# Patient Record
Sex: Male | Born: 1960 | Race: White | Hispanic: No | Marital: Single | Smoking: Never smoker
Health system: Southern US, Community
[De-identification: ages and names within clinical notes are randomized; demographics above are authoritative.]

## PROBLEM LIST (undated history)

## (undated) DIAGNOSIS — K219 Gastro-esophageal reflux disease without esophagitis: Secondary | ICD-10-CM

## (undated) DIAGNOSIS — I10 Essential (primary) hypertension: Secondary | ICD-10-CM

## (undated) DIAGNOSIS — I82409 Acute embolism and thrombosis of unspecified deep veins of unspecified lower extremity: Secondary | ICD-10-CM

## (undated) DIAGNOSIS — G8929 Other chronic pain: Secondary | ICD-10-CM

## (undated) HISTORY — PX: TOTAL HIP ARTHROPLASTY: SHX124

---

## 2009-12-11 ENCOUNTER — Inpatient Hospital Stay (HOSPITAL_COMMUNITY): Admission: RE | Admit: 2009-12-11 | Discharge: 2009-12-19 | Payer: Self-pay | Admitting: Orthopedic Surgery

## 2009-12-12 ENCOUNTER — Encounter (INDEPENDENT_AMBULATORY_CARE_PROVIDER_SITE_OTHER): Payer: Self-pay | Admitting: Orthopedic Surgery

## 2009-12-22 ENCOUNTER — Emergency Department (HOSPITAL_COMMUNITY): Admission: EM | Admit: 2009-12-22 | Discharge: 2009-12-23 | Payer: Self-pay | Admitting: Emergency Medicine

## 2009-12-22 ENCOUNTER — Encounter (INDEPENDENT_AMBULATORY_CARE_PROVIDER_SITE_OTHER): Payer: Self-pay | Admitting: Emergency Medicine

## 2010-04-30 LAB — PROTIME-INR
INR: 1.33 (ref 0.00–1.49)
Prothrombin Time: 16.7 seconds — ABNORMAL HIGH (ref 11.6–15.2)

## 2010-05-01 LAB — BASIC METABOLIC PANEL
BUN: 3 mg/dL — ABNORMAL LOW (ref 6–23)
BUN: 4 mg/dL — ABNORMAL LOW (ref 6–23)
CO2: 25 mEq/L (ref 19–32)
CO2: 29 mEq/L (ref 19–32)
Chloride: 101 mEq/L (ref 96–112)
Chloride: 103 mEq/L (ref 96–112)
Chloride: 107 mEq/L (ref 96–112)
Creatinine, Ser: 0.65 mg/dL (ref 0.4–1.5)
Creatinine, Ser: 0.94 mg/dL (ref 0.4–1.5)
GFR calc Af Amer: >60 mL/min (ref >60)
GFR calc Af Amer: >60 mL/min (ref >60)
GFR calc Af Amer: >60 mL/min (ref >60)
GFR calc non Af Amer: >60 mL/min (ref >60)
GFR calc non Af Amer: >60 mL/min (ref >60)
Glucose, Bld: 132 mg/dL — ABNORMAL HIGH (ref 70–99)
Potassium: 3.2 mEq/L — ABNORMAL LOW (ref 3.5–5.1)
Potassium: 4 mEq/L (ref 3.5–5.1)
Potassium: 5.1 mEq/L (ref 3.5–5.1)
Sodium: 137 mEq/L (ref 135–145)
Sodium: 137 mEq/L (ref 135–145)

## 2010-05-01 LAB — PROTIME-INR
INR: 0.96 (ref 0.00–1.49)
INR: 1.77 — ABNORMAL HIGH (ref 0.00–1.49)
INR: 2.45 — ABNORMAL HIGH (ref 0.00–1.49)
INR: 2.85 — ABNORMAL HIGH (ref 0.00–1.49)
Prothrombin Time: 13 seconds (ref 11.6–15.2)
Prothrombin Time: 26.7 seconds — ABNORMAL HIGH (ref 11.6–15.2)

## 2010-05-01 LAB — CBC
HCT: 30.3 % — ABNORMAL LOW (ref 39.0–52.0)
HCT: 33.9 % — ABNORMAL LOW (ref 39.0–52.0)
HCT: 42.5 % (ref 39.0–52.0)
Hemoglobin: 14.7 g/dL (ref 13.0–17.0)
Hemoglobin: 8.8 g/dL — ABNORMAL LOW (ref 13.0–17.0)
Hemoglobin: 9.9 g/dL — ABNORMAL LOW (ref 13.0–17.0)
MCH: 32 pg (ref 26.0–34.0)
MCH: 32.2 pg (ref 26.0–34.0)
MCH: 33.1 pg (ref 26.0–34.0)
MCV: 96 fL (ref 78.0–100.0)
MCV: 97.5 fL (ref 78.0–100.0)
MCV: 97.7 fL (ref 78.0–100.0)
MCV: 99 fL (ref 78.0–100.0)
Platelets: 298 10*3/uL (ref 150–400)
Platelets: 330 10*3/uL (ref 150–400)
Platelets: 406 10*3/uL — ABNORMAL HIGH (ref 150–400)
Platelets: 428 10*3/uL — ABNORMAL HIGH (ref 150–400)
RBC: 2.73 MIL/uL — ABNORMAL LOW (ref 4.22–5.81)
RBC: 2.78 MIL/uL — ABNORMAL LOW (ref 4.22–5.81)
RBC: 3.06 MIL/uL — ABNORMAL LOW (ref 4.22–5.81)
RBC: 3.47 MIL/uL — ABNORMAL LOW (ref 4.22–5.81)
RBC: 4.39 MIL/uL (ref 4.22–5.81)
RDW: 12.6 % (ref 11.5–15.5)
RDW: 13.1 % (ref 11.5–15.5)
WBC: 10.9 10*3/uL — ABNORMAL HIGH (ref 4.0–10.5)
WBC: 13.9 10*3/uL — ABNORMAL HIGH (ref 4.0–10.5)
WBC: 8 10*3/uL (ref 4.0–10.5)

## 2010-05-01 LAB — COMPREHENSIVE METABOLIC PANEL
ALT: 13 U/L (ref 0–53)
Albumin: 2.6 g/dL — ABNORMAL LOW (ref 3.5–5.2)
Alkaline Phosphatase: 62 U/L (ref 39–117)
BUN: 2 mg/dL — ABNORMAL LOW (ref 6–23)
BUN: 5 mg/dL — ABNORMAL LOW (ref 6–23)
CO2: 31 mEq/L (ref 19–32)
Chloride: 105 mEq/L (ref 96–112)
Creatinine, Ser: 0.65 mg/dL (ref 0.4–1.5)
GFR calc non Af Amer: >60 mL/min (ref >60)
Potassium: 3.2 mEq/L — ABNORMAL LOW (ref 3.5–5.1)
Sodium: 139 mEq/L (ref 135–145)
Total Bilirubin: 0.4 mg/dL (ref 0.3–1.2)
Total Protein: 5.9 g/dL — ABNORMAL LOW (ref 6.0–8.3)

## 2010-05-01 LAB — CLOSTRIDIUM DIFFICILE EIA: C difficile Toxins A+B, EIA: NEGATIVE

## 2010-05-01 LAB — GLUCOSE, CAPILLARY

## 2010-05-01 LAB — TYPE AND SCREEN: Antibody Screen: NEGATIVE

## 2010-05-01 LAB — APTT: aPTT: 29 seconds (ref 24–37)

## 2011-03-11 IMAGING — CR DG ABDOMEN 1V
1 series · 1 of 1 positions shown · non-contrast
Comparison: 12/15/2009 and 12/14/2009.

CLINICAL DATA: Follow up ileus.  Patient is recently postop for hip
arthroplasty.

ABDOMEN - 1 VIEW

[t abdomen supine]
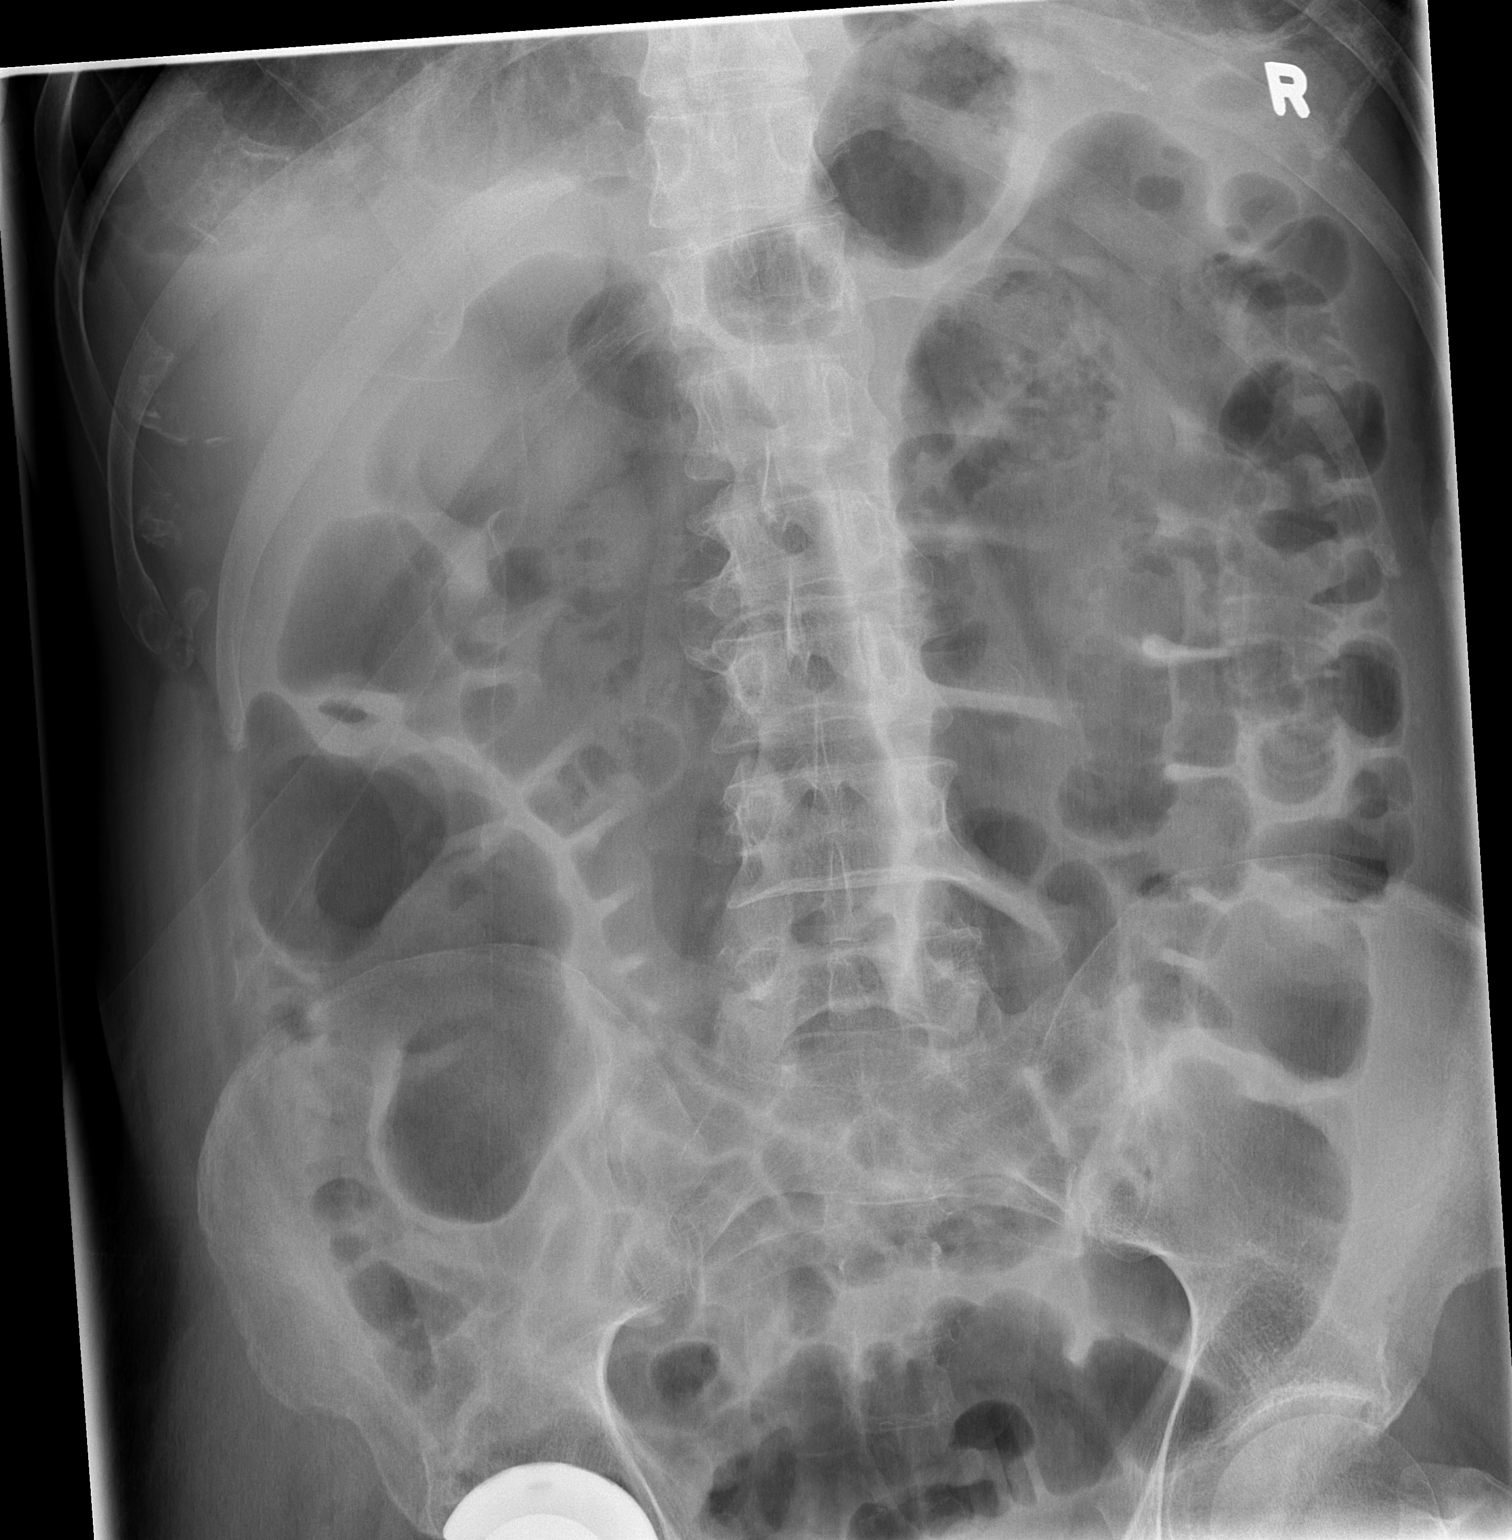

[1 of 1 positions shown; findings below may reference images not displayed]

FINDINGS: There is diffuse gas throughout the colon and some bowel
loops.  The gaseous distention of the colon has significantly
decreased since 12/15/2009.  The cecum now measures 6.6 cm
transverse diameter (previously 10.7 cm on 12/15/2009).
Additionally, the diameter of the transverse and descending colon
has decreased.  The stomach is not distended.  Postoperative
changes of right hip arthroplasty partially imaged.
IMPRESSION: Decreasing colonic distention.  Findings compatible with resolving
ileus in this postoperative patient.

## 2012-02-27 ENCOUNTER — Encounter: Payer: Self-pay | Admitting: Physical Medicine & Rehabilitation

## 2012-03-22 ENCOUNTER — Ambulatory Visit: Payer: Medicare Other | Admitting: Physical Medicine & Rehabilitation

## 2012-03-22 ENCOUNTER — Encounter: Payer: Medicare Other | Attending: Physical Medicine & Rehabilitation

## 2015-11-07 NOTE — Plan of Care (Signed)
55 year old male presenting with shortness of breath has PE on CT Angio. Initially mildly hypotensive but improved with iv fluids. PCCM was consulted. Advised admission under hospitalist and Heparin.  Midge MiniumArshad Kakrakandy.

## 2015-11-08 ENCOUNTER — Inpatient Hospital Stay (HOSPITAL_COMMUNITY): Payer: Medicare HMO

## 2015-11-08 ENCOUNTER — Inpatient Hospital Stay (HOSPITAL_COMMUNITY)
Admission: AD | Admit: 2015-11-08 | Discharge: 2015-11-12 | DRG: 175 | Disposition: A | Discharge status: Home / Self Care | Payer: Medicare HMO | Source: Other Acute Inpatient Hospital | Attending: Family Medicine | Admitting: Family Medicine

## 2015-11-08 ENCOUNTER — Encounter (HOSPITAL_COMMUNITY): Payer: Self-pay | Admitting: Internal Medicine

## 2015-11-08 DIAGNOSIS — Z86718 Personal history of other venous thrombosis and embolism: Secondary | ICD-10-CM

## 2015-11-08 DIAGNOSIS — I82491 Acute embolism and thrombosis of other specified deep vein of right lower extremity: Secondary | ICD-10-CM | POA: Diagnosis not present

## 2015-11-08 DIAGNOSIS — Z96649 Presence of unspecified artificial hip joint: Secondary | ICD-10-CM | POA: Diagnosis present

## 2015-11-08 DIAGNOSIS — D72829 Elevated white blood cell count, unspecified: Secondary | ICD-10-CM | POA: Diagnosis present

## 2015-11-08 DIAGNOSIS — G8929 Other chronic pain: Secondary | ICD-10-CM | POA: Diagnosis present

## 2015-11-08 DIAGNOSIS — I82413 Acute embolism and thrombosis of femoral vein, bilateral: Secondary | ICD-10-CM | POA: Diagnosis present

## 2015-11-08 DIAGNOSIS — I2699 Other pulmonary embolism without acute cor pulmonale: Secondary | ICD-10-CM | POA: Diagnosis present

## 2015-11-08 DIAGNOSIS — I82511 Chronic embolism and thrombosis of right femoral vein: Secondary | ICD-10-CM | POA: Diagnosis not present

## 2015-11-08 DIAGNOSIS — J181 Lobar pneumonia, unspecified organism: Secondary | ICD-10-CM

## 2015-11-08 DIAGNOSIS — Z23 Encounter for immunization: Secondary | ICD-10-CM

## 2015-11-08 DIAGNOSIS — K219 Gastro-esophageal reflux disease without esophagitis: Secondary | ICD-10-CM | POA: Diagnosis present

## 2015-11-08 DIAGNOSIS — J189 Pneumonia, unspecified organism: Secondary | ICD-10-CM | POA: Diagnosis present

## 2015-11-08 DIAGNOSIS — I10 Essential (primary) hypertension: Secondary | ICD-10-CM | POA: Diagnosis present

## 2015-11-08 DIAGNOSIS — N179 Acute kidney failure, unspecified: Secondary | ICD-10-CM | POA: Diagnosis present

## 2015-11-08 DIAGNOSIS — I82403 Acute embolism and thrombosis of unspecified deep veins of lower extremity, bilateral: Secondary | ICD-10-CM | POA: Diagnosis not present

## 2015-11-08 DIAGNOSIS — I959 Hypotension, unspecified: Secondary | ICD-10-CM | POA: Diagnosis present

## 2015-11-08 DIAGNOSIS — I2692 Saddle embolus of pulmonary artery without acute cor pulmonale: Secondary | ICD-10-CM | POA: Diagnosis not present

## 2015-11-08 DIAGNOSIS — I950 Idiopathic hypotension: Secondary | ICD-10-CM

## 2015-11-08 HISTORY — DX: Gastro-esophageal reflux disease without esophagitis: K21.9

## 2015-11-08 HISTORY — DX: Acute embolism and thrombosis of unspecified deep veins of unspecified lower extremity: I82.409

## 2015-11-08 HISTORY — DX: Other chronic pain: G89.29

## 2015-11-08 HISTORY — DX: Essential (primary) hypertension: I10

## 2015-11-08 LAB — ECHOCARDIOGRAM COMPLETE
HEIGHTINCHES: 72 in
WEIGHTICAEL: 2412.71 [oz_av]

## 2015-11-08 LAB — CBC
HEMATOCRIT: 38.1 % — AB (ref 39.0–52.0)
Hemoglobin: 11.7 g/dL — ABNORMAL LOW (ref 13.0–17.0)
MCH: 28.3 pg (ref 26.0–34.0)
MCHC: 30.7 g/dL (ref 30.0–36.0)
MCV: 92.3 fL (ref 78.0–100.0)
Platelets: 281 10*3/uL (ref 150–400)
RBC: 4.13 MIL/uL — ABNORMAL LOW (ref 4.22–5.81)
RDW: 15 % (ref 11.5–15.5)
WBC: 5.7 10*3/uL (ref 4.0–10.5)

## 2015-11-08 LAB — TROPONIN I

## 2015-11-08 LAB — MRSA PCR SCREENING: MRSA BY PCR: NEGATIVE

## 2015-11-08 LAB — HEPARIN LEVEL (UNFRACTIONATED)
Heparin Unfractionated: 0.3 IU/mL (ref 0.30–0.70)
Heparin Unfractionated: 0.24 IU/mL — ABNORMAL LOW (ref 0.30–0.70)

## 2015-11-08 MED ORDER — PANTOPRAZOLE SODIUM 40 MG PO TBEC
80.0000 mg | DELAYED_RELEASE_TABLET | Freq: Every day | ORAL | Status: DC
  Administered 2015-11-08 – 2015-11-12 (×5): 80 mg via ORAL
  Filled 2015-11-08 (×7): qty 2

## 2015-11-08 MED ORDER — DIAZEPAM 5 MG PO TABS
10.0000 mg | ORAL_TABLET | Freq: Two times a day (BID) | ORAL | Status: DC
  Administered 2015-11-08 – 2015-11-10 (×5): 10 mg via ORAL
  Filled 2015-11-08 (×5): qty 2

## 2015-11-08 MED ORDER — AZITHROMYCIN 250 MG PO TABS
250.0000 mg | ORAL_TABLET | Freq: Every day | ORAL | Status: AC
  Administered 2015-11-09 – 2015-11-12 (×4): 250 mg via ORAL
  Filled 2015-11-08 (×4): qty 1

## 2015-11-08 MED ORDER — OXYCODONE HCL 5 MG PO TABS
20.0000 mg | ORAL_TABLET | Freq: Four times a day (QID) | ORAL | Status: DC | PRN
  Administered 2015-11-08 – 2015-11-12 (×10): 20 mg via ORAL
  Filled 2015-11-08 (×10): qty 4

## 2015-11-08 MED ORDER — HEPARIN (PORCINE) IN NACL 100-0.45 UNIT/ML-% IJ SOLN
1550.0000 [IU]/h | INTRAMUSCULAR | Status: AC
  Administered 2015-11-08: 1150 [IU]/h via INTRAVENOUS
  Administered 2015-11-08: 1300 [IU]/h via INTRAVENOUS
  Administered 2015-11-09 – 2015-11-10 (×2): 1550 [IU]/h via INTRAVENOUS
  Filled 2015-11-08 (×3): qty 250

## 2015-11-08 MED ORDER — SODIUM CHLORIDE 0.9 % IV SOLN
INTRAVENOUS | Status: DC
  Administered 2015-11-08 – 2015-11-11 (×8): via INTRAVENOUS

## 2015-11-08 MED ORDER — AZITHROMYCIN 500 MG PO TABS
500.0000 mg | ORAL_TABLET | Freq: Every day | ORAL | Status: AC
  Administered 2015-11-08: 500 mg via ORAL
  Filled 2015-11-08: qty 1

## 2015-11-08 MED ORDER — INFLUENZA VAC SPLIT QUAD 0.5 ML IM SUSY
0.5000 mL | PREFILLED_SYRINGE | INTRAMUSCULAR | Status: AC
  Administered 2015-11-09: 0.5 mL via INTRAMUSCULAR
  Filled 2015-11-08: qty 0.5

## 2015-11-08 NOTE — Progress Notes (Signed)
ANTICOAGULATION CONSULT NOTE - Initial Consult  Pharmacy Consult for Heparin Indication: pulmonary embolus  Allergies  Allergen Reactions  . Aspirin     Patient Measurements: Height: 6' (182.9 cm) Weight: 150 lb 12.7 oz (68.4 kg) IBW/kg (Calculated) : 77.6 Heparin Dosing Weight:   Vital Signs: Temp: 97.5 F (36.4 C) (09/21 0520) Temp Source: Oral (09/21 0520) BP: 97/75 (09/21 0600) Pulse Rate: 67 (09/21 0600)  Labs: No results for input(s): HGB, HCT, PLT, APTT, LABPROT, INR, HEPARINUNFRC, HEPRLOWMOCWT, CREATININE, CKTOTAL, CKMB, TROPONINI in the last 72 hours.  CrCl cannot be calculated (Patient's most recent lab result is older than the maximum 21 days allowed.).   Medical History: Past Medical History:  Diagnosis Date  . Chronic pain   . 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 GERD (gastroesophageal reflux disease)   . HTN (hypertension)     Medications:  See electronic med rec  Assessment: 55 y.o. M [facility] chest shows b/l PE with evidence of R heart strain and large clot burden.  [facility] WBC 15, Hgb 12.5, HCt 37.8, plt 343, INR 1.2, SCr 1.8,   [facility] with 4000 units bolus and 1050 units/hr.   Goal of Therapy:  Heparin level 0.3-0.7 units/ml Monitor platelets by anticoagulation protocol: Yes   Plan:  Continue heparin at 1050 units/hr Stat heparin level Daily heparin level and CBC  Christoper Fabianaron Noelle Sease, PharmD, BCPS Clinical pharmacist, pager [phone] 11/08/2015,6:54 AM

## 2015-11-08 NOTE — Progress Notes (Signed)
ANTICOAGULATION CONSULT NOTE - Follow Up Consult  Pharmacy Consult for Heparin Indication: PE  Allergies  Allergen Reactions  . Aspirin Hives and Rash    Patient Measurements: Height: 6' (182.9 cm) Weight: 150 lb 12.7 oz (68.4 kg) IBW/kg (Calculated) : 77.6  Vital Signs: Temp: 97.5 F (36.4 C) (09/21 0520) Temp Source: Oral (09/21 0520) BP: 91/61 (09/21 0800) Pulse Rate: 57 (09/21 0800)  Labs:  Recent Labs  11/08/15 0754  HEPARINUNFRC 0.30  TROPONINI <0.03    CrCl cannot be calculated (Patient's most recent lab result is older than the maximum 21 days allowed.).   Medications:  Scheduled:  . [START ON 11/09/2015] azithromycin  250 mg Oral Daily  . diazepam  10 mg Oral BID  . [START ON 11/09/2015] Influenza vac split quadrivalent PF  0.5 mL Intramuscular Tomorrow-1000  . pantoprazole  80 mg Oral Daily    Assessment: 55yo male with PE and R-heart strain, large clot burden.  Heparin level this AM is 0.3 which is just within goal range.  No bleeding noted.  Will bump rate up to assure maintain therapeutic range  Goal of Therapy:  Heparin level 0.3-0.7 units/ml Monitor platelets by anticoagulation protocol: Yes   Plan:  Increase heparin to 1150 units/hr Repeat heparin level in 6hr, & CBC at this time as well Daily Heparin level and CBC Watch for s/s of bleeding  Marisue HumbleKendra Azalee Weimer, PharmD Clinical Pharmacist  [facility]- [facility]

## 2015-11-08 NOTE — Progress Notes (Signed)
Interval Progress Note  [facility]   Patient seen and examined at bedside. Patient report significant improvement after he was admitted. Discussed with patient length of his condition. Questions answered. No other complaints.  Patient's vital signs have improved. Blood pressure remains in the lower side with IV fluids seems to be working. We'll continue heparin drip and IV fluids.  Physical exam unremarkable. Homans sign is negative  We will continue to monitor and ICU unit. Doppler ultrasound ordered. Will follow up with critical care team.  Other conditions as per H&P  Latrelle DodrillEdwin Silva, MD  [facility] P: [phone]

## 2015-11-08 NOTE — H&P (Signed)
History and Physical    Allen GalaKevin L Molenda NFA:[id number] DOB: 09/18/1960 DOA: 11/08/2015   PCP: No primary care provider on file. Chief Complaint: No chief complaint on file.   HPI: Allen Holt is a 55 y.o. male with medical history significant of multiple DVTs in LE following a hip FX a few years back, had been on coumadin until about a year ago.  Family history of blood clots in his father (unsure of exact clotting disorder).  Patient presented to his PCP 2 days ago with c/o 2-3 weeks of passing out.  Symptoms were persistent since onset, actually slightly improving.  Associated generalized weakness and some non-productive cough.  His PCP got labs, noted that his BP was running very low.  Ultimately labs came back yesterday showing acute kidney injury and [facility].  ED Course: [facility], SBP running 90s, improved with IVF.  WBC 15k, creat of 1.8 (presumably acute given above history), sating 94% on room air, 98% on 2L.  EKG showed T wave inversions in 2,3,AVF, ST depression and T wave inversion in V2-V4.  CTA chest was performed and demonstrated large saddle embolus, as well as some tree-in-bud appearance in RLL ? aspiration vs PNA.  Review of Systems: As per HPI otherwise 10 point review of systems negative.    Past Medical History:  Diagnosis Date  . Chronic pain   . 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 GERD (gastroesophageal reflux disease)   . HTN (hypertension)     Past Surgical History:  Procedure Laterality Date  . TOTAL HIP ARTHROPLASTY     for hip fx     reports that he has never smoked. He does not have any smokeless tobacco history on file. He reports that he does not drink alcohol or use drugs.  Allergies  Allergen Reactions  . Aspirin     Family History  Problem Relation Age of Onset  . Clotting disorder Father       Prior to Admission medications   Not on File    Physical  Exam: Vitals:   11/08/15 0520 11/08/15 0600  BP: (!) 80/61 97/75  Pulse:  67  Resp:  (!) 22  Temp: 97.5 F (36.4 C)   TempSrc: Oral   SpO2:  98%  Weight:  68.4 kg (150 lb 12.7 oz)  Height:  6' (1.829 m)      Constitutional: NAD, calm, comfortable Eyes: PERRL, lids and conjunctivae normal ENMT: Mucous membranes are moist. Posterior pharynx clear of any exudate or lesions.Normal dentition.  Neck: normal, supple, no masses, no thyromegaly Respiratory: clear to auscultation bilaterally, no wheezing, no crackles. Normal respiratory effort. No accessory muscle use.  Cardiovascular: Regular rate and rhythm, no murmurs / rubs / gallops. No extremity edema. 2+ pedal pulses. No carotid bruits.  Abdomen: no tenderness, no masses palpated. No hepatosplenomegaly. Bowel sounds positive.  Musculoskeletal: no clubbing / cyanosis. No joint deformity upper and lower extremities. Good ROM, no contractures. Normal muscle tone.  Skin: no rashes, lesions, ulcers. No induration Neurologic: CN 2-12 grossly intact. Sensation intact, DTR normal. Strength 5/5 in all 4.  Psychiatric: Normal judgment and insight. Alert and oriented x 3. Normal mood.    Labs on Admission: I have personally reviewed following labs and imaging studies  CBC: No results for input(s): WBC, NEUTROABS, HGB, HCT, MCV, PLT in the last 168 hours. Basic Metabolic Panel: No results for input(s): NA,  K, CL, CO2, GLUCOSE, BUN, CREATININE, CALCIUM, MG, PHOS in the last 168 hours. GFR: CrCl cannot be calculated (Patient's most recent lab result is older than the maximum 21 days allowed.). Liver Function Tests: No results for input(s): AST, ALT, ALKPHOS, BILITOT, PROT, ALBUMIN in the last 168 hours. No results for input(s): LIPASE, AMYLASE in the last 168 hours. No results for input(s): AMMONIA in the last 168 hours. Coagulation Profile: No results for input(s): INR, PROTIME in the last 168 hours. Cardiac Enzymes: No results for  input(s): CKTOTAL, CKMB, CKMBINDEX, TROPONINI in the last 168 hours. BNP (last 3 results) No results for input(s): PROBNP in the last 8760 hours. HbA1C: No results for input(s): HGBA1C in the last 72 hours. CBG: No results for input(s): GLUCAP in the last 168 hours. Lipid Profile: No results for input(s): CHOL, HDL, LDLCALC, TRIG, CHOLHDL, LDLDIRECT in the last 72 hours. Thyroid Function Tests: No results for input(s): TSH, T4TOTAL, FREET4, T3FREE, THYROIDAB in the last 72 hours. Anemia Panel: No results for input(s): VITAMINB12, FOLATE, FERRITIN, TIBC, IRON, RETICCTPCT in the last 72 hours. Urine analysis: No results found for: COLORURINE, APPEARANCEUR, LABSPEC, PHURINE, GLUCOSEU, HGBUR, BILIRUBINUR, KETONESUR, PROTEINUR, UROBILINOGEN, NITRITE, LEUKOCYTESUR Sepsis Labs: @LABRCNTIP (procalcitonin:4,lacticidven:4) )No results found for this or any previous visit (from the past 240 hour(s)).   Radiological Exams on Admission: No results found.  EKG: Independently reviewed.  Assessment/Plan Principal Problem:   Subacute massive pulmonary embolism (HCC) Active Problems:   HTN (hypertension)   AKI (acute kidney injury) (HCC)   Leukocytosis   Right lower lobe pneumonia    1. Subacute massive PE - 1. BP slightly improved to 90s systolic with IVF 2. Holding BP meds 3. Heparin gtt 4. PCCM consulted 1. 2d echo pending 2. Troponin pending 3. They will round on patient and decide about catheter directed TPA 5. Bed rest for now 6. Will go ahead and let him have a cardiac diet 7. Will hold off on hypercoag work up as I dont think it will change management, given personal history of multiple DVTs in the past he almost certainly needs to be on life long anticoagulation now. 2. HTN - hold all HTN meds due to hypotension 3. AKI - likely due to hypotension 1. IVF 2. Holding BP meds 3. Holding nephrotoxic lisinopril 4. Repeat BMP in AM 4. RLL PNA - 1. Given tree in bud appearance on  CT scan, leukocytosis of 15k will go ahead and treat as mild PNA, putting patient on Z pak for now 2. Trending leukocytosis with repeat CBC tomorrow   DVT prophylaxis: Heparin gtt Code Status: Full Family Communication: No family in room Consults called: PCCM seeing patient, spoke with Dr. Belia Heman Admission status: Admit to inpatient   Hillary Bow DO [facility] Pager [phone] from 7PM-7AM  If 7AM-7PM, please contact the day physician for the patient [email or web address] Password [facility]  11/08/2015, 6:25 AM

## 2015-11-08 NOTE — Progress Notes (Signed)
  Echocardiogram 2D Echocardiogram has been performed.  Allen SavoyCasey N Jakobe Holt 11/08/2015, 9:45 AM

## 2015-11-08 NOTE — Progress Notes (Signed)
eLink Physician-Brief Progress Note Patient Name: Allen GalaKevin L Holt DOB: [birth year] MRN: [id number]   Date of Service  11/08/2015  HPI/Events of Note  PE, on heprain infusion No BP compromise, No acute resp/cardiac issues  eICU Interventions  Continue heparin infusion, assess for RT heart strain, check ECHo, troponin     Intervention Category Evaluation Type: New Patient Evaluation  Tyresse Jayson 11/08/2015, 5:51 AM

## 2015-11-08 NOTE — Consult Note (Signed)
Name: Allen Holt MRN: [id number] DOB: Sep 22, 1960    ADMISSION DATE:  11/08/2015 CONSULTATION DATE:  9/21   REFERRING MD :  Dr. Randel PiggSilva Holt [facility]  CHIEF COMPLAINT:  PE  BRIEF PATIENT DESCRIPTION: 55 year old male with PMH of DVT after hip fx and family history of unknown clotting disorder. Has been off warfarin for about year and is now admitted with PE after 2-3 weeks syncope. Started on heparin. PCCM to see for ? EKOS.  SIGNIFICANT EVENTS    STUDIES:  CT chest 9/21 [name] >  HISTORY OF PRESENT ILLNESS:  55 year old male with PMH as below, which is significant for DVT following hip fracture. He has been off of coumadin for about a year now. He also has family history of clots. He presented to PCP 9/19 with complaints of 2-3 weeks of several syncopal episodes, generalized weakness and non-productive cough. Labs were checked and [name].  Initially in the ED he was hypotensive, which improved with IVF. EKG with some ST changes in several leads. CTA was done demonstrating large saddle embolus and some possible tree-in-bud appearance of RLL. [name] evaluation and consideration of EKOS.   PAST MEDICAL HISTORY :   has a past medical history of Chronic pain; DVT (deep venous thrombosis) (HCC); GERD (gastroesophageal reflux disease); and HTN (hypertension).  has a past surgical history that includes Total hip arthroplasty. Prior to Admission medications   Medication Sig Start Date End Date Taking? Authorizing Provider  azithromycin (ZITHROMAX) 250 MG tablet Take 1-2 tablets by mouth daily. 500 mg on day 1 250 mg x4 days 11/06/15  Yes Historical Provider, MD  diazepam (VALIUM) 10 MG tablet Take 10 mg by mouth daily. For shaking 11/02/15  Yes Historical Provider, MD  doxepin (SINEQUAN) 150 MG capsule Take 150 mg by mouth at bedtime. For anxiety 11/01/15  Yes Historical Provider, MD  esomeprazole (NEXIUM) 40 MG capsule Take 40 mg by  mouth daily. 10/19/15  Yes Historical Provider, MD  lisinopril (PRINIVIL,ZESTRIL) 10 MG tablet Take 10 mg by mouth daily. 11/06/15  Yes Historical Provider, MD  LYRICA 50 MG capsule Take 50 mg by mouth 3 (three) times daily. 09/05/15  Yes Historical Provider, MD  metoprolol succinate (TOPROL-XL) 100 MG 24 hr tablet Take 50 mg by mouth every morning.  10/19/15  Yes Historical Provider, MD  naproxen (NAPROSYN) 500 MG tablet Take 500 mg by mouth 2 (two) times daily as needed for pain. 10/19/15  Yes Historical Provider, MD  Oxycodone HCl 20 MG TABS Take 20 mg by mouth 4 (four) times daily as needed for pain. 11/06/15  Yes Historical Provider, MD  ranitidine (ZANTAC) 150 MG tablet Take 150 mg by mouth 2 (two) times daily. 11/06/15  Yes Historical Provider, MD   Allergies  Allergen Reactions  . Aspirin Hives and Rash    FAMILY HISTORY:  family history includes Clotting disorder in his father. SOCIAL HISTORY:  reports that he has never smoked. He does not have any smokeless tobacco history on file. He reports that he does not drink alcohol or use drugs.  REVIEW OF SYSTEMS:   Bolds are positive  Constitutional: weight loss, gain, night sweats, Fevers, chills, fatigue .  HEENT: headaches, Sore throat, sneezing, nasal congestion, post nasal drip, Difficulty swallowing, Tooth/dental problems, visual complaints visual changes, ear ache CV:  chest pain, radiates: ,Orthopnea, PND, swelling in lower extremities, dizziness, palpitations, syncope.  GI  heartburn, indigestion, abdominal pain, nausea, vomiting, diarrhea, change in bowel habits, loss of appetite, bloody stools.  Resp: cough, productive: , hemoptysis, dyspnea, chest pain, pleuritic.  Skin: rash or itching or icterus GU: dysuria, change in color of urine, urgency or frequency. flank pain, hematuria  MS: joint pain or swelling. decreased range of motion  Psych: change in mood or affect. depression or anxiety.  Neuro: difficulty with speech, weakness,  numbness, ataxia    SUBJECTIVE:   VITAL SIGNS: Temp:  [97.5 F (36.4 C)] 97.5 F (36.4 C) (09/21 0520) Pulse Rate:  [57-67] 57 (09/21 0800) Resp:  [14-22] 14 (09/21 0800) BP: (80-97)/(61-75) 91/61 (09/21 0800) SpO2:  [98 %-99 %] 99 % (09/21 0800) Weight:  [68.4 kg (150 lb 12.7 oz)] 68.4 kg (150 lb 12.7 oz) (09/21 0600)  PHYSICAL EXAMINATION: General:  Middle aged male of normal body habitus in NAD Neuro:  Alert, oriented, slow to respond to some questions HEENT:  [place]/AT, PERRL, no JVD Cardiovascular:  RRR, no MRG Lungs:  Clear bilateral breath sounds Abdomen:  Soft, non-tender, non-distended Musculoskeletal:  No acute deformity Skin:  Grossly intact  No results for input(s): NA, K, CL, CO2, BUN, CREATININE, GLUCOSE in the last 168 hours. No results for input(s): HGB, HCT, WBC, PLT in the last 168 hours. No results found.  ASSESSMENT / PLAN:  PE - large saddle embolus per CTA at [facility]. Currently hemodynamically stable, on minimal O2, negative troponin.  Echo done but not yet read. Hx DVT  Family hx clots   REC -  Supplemental O2 as needed to keep sats >92% Continue heparin gtt  F/u echo results  Monitor hemodynamics, BP marginal. One reason to consider EKOS Consider EKOS if change in clinical status or significant R heart strain on echo  Repeat EKG Continue trend troponin  Check BLE venous doppler, if large clot could benefit from filter.  Will need lifelong anticoagulation at this point  Hold off on further hypercoag w/u as will not change management   Joneen Roach, AGACNP-BC [facility] Pulmonology/Critical Care Pager [phone] or [phone]  11/08/2015 12:29 PM

## 2015-11-08 NOTE — Progress Notes (Signed)
ANTICOAGULATION CONSULT NOTE - Follow Up Consult  Pharmacy Consult for Heparin Indication: PE  Allergies  Allergen Reactions  . Aspirin Hives and Rash    Patient Measurements: Height: 6' (182.9 cm) Weight: 150 lb 12.7 oz (68.4 kg) IBW/kg (Calculated) : 77.6  Heparin dosing weight = 68 kg  Vital Signs: Temp: 96.4 F (35.8 C) (09/21 1729) Temp Source: Axillary (09/21 1729) BP: 84/55 (09/21 1729) Pulse Rate: 68 (09/21 1729)  Labs:  Recent Labs  11/08/15 0754 11/08/15 1049 11/08/15 1807  HGB  --   --  11.7*  HCT  --   --  38.1*  PLT  --   --  281  HEPARINUNFRC 0.30  --  0.24*  TROPONINI <0.03 <0.03  --     CrCl cannot be calculated (Patient's most recent lab result is older than the maximum 21 days allowed.).   Medications:  Scheduled:  . [START ON 11/09/2015] azithromycin  250 mg Oral Daily  . diazepam  10 mg Oral BID  . [START ON 11/09/2015] Influenza vac split quadrivalent PF  0.5 mL Intramuscular Tomorrow-1000  . pantoprazole  80 mg Oral Daily    Assessment: 55yo male with PE and R-heart strain, large clot burden. On IV heparin Heparin level = 0.24 on 1150 units/hr. No issue with lines, infusions, no bleeding noted  Goal of Therapy:  Heparin level 0.3-0.7 units/ml Monitor platelets by anticoagulation protocol: Yes   Plan:  Increase heparin to 1300 units/hr F/u AM labs Watch for s/s of bleeding  Bayard HuggerMei Hagan [place], PharmD, BCPS  Clinical Pharmacist  Pager: [phone]

## 2015-11-09 ENCOUNTER — Inpatient Hospital Stay (HOSPITAL_COMMUNITY): Payer: Medicare HMO

## 2015-11-09 DIAGNOSIS — I82491 Acute embolism and thrombosis of other specified deep vein of right lower extremity: Secondary | ICD-10-CM

## 2015-11-09 DIAGNOSIS — I82511 Chronic embolism and thrombosis of right femoral vein: Secondary | ICD-10-CM

## 2015-11-09 DIAGNOSIS — I82403 Acute embolism and thrombosis of unspecified deep veins of lower extremity, bilateral: Secondary | ICD-10-CM

## 2015-11-09 DIAGNOSIS — I2692 Saddle embolus of pulmonary artery without acute cor pulmonale: Secondary | ICD-10-CM

## 2015-11-09 DIAGNOSIS — I82413 Acute embolism and thrombosis of femoral vein, bilateral: Secondary | ICD-10-CM

## 2015-11-09 DIAGNOSIS — I2699 Other pulmonary embolism without acute cor pulmonale: Secondary | ICD-10-CM

## 2015-11-09 LAB — BASIC METABOLIC PANEL
ANION GAP: 7 (ref 5–15)
BUN: 11 mg/dL (ref 6–20)
CHLORIDE: 105 mmol/L (ref 101–111)
CO2: 27 mmol/L (ref 22–32)
Calcium: 8.3 mg/dL — ABNORMAL LOW (ref 8.9–10.3)
Creatinine, Ser: 1.02 mg/dL (ref 0.61–1.24)
GFR calc non Af Amer: >60 mL/min (ref >60)
Glucose, Bld: 116 mg/dL — ABNORMAL HIGH (ref 65–99)
POTASSIUM: 4.2 mmol/L (ref 3.5–5.1)
SODIUM: 139 mmol/L (ref 135–145)

## 2015-11-09 LAB — CBC
HEMATOCRIT: 40.4 % (ref 39.0–52.0)
HEMOGLOBIN: 12.1 g/dL — AB (ref 13.0–17.0)
MCH: 28.1 pg (ref 26.0–34.0)
MCHC: 30 g/dL (ref 30.0–36.0)
MCV: 93.7 fL (ref 78.0–100.0)
Platelets: 292 10*3/uL (ref 150–400)
RBC: 4.31 MIL/uL (ref 4.22–5.81)
RDW: 14.9 % (ref 11.5–15.5)
WBC: 6.1 10*3/uL (ref 4.0–10.5)

## 2015-11-09 LAB — HEPARIN LEVEL (UNFRACTIONATED)
HEPARIN UNFRACTIONATED: 0.17 [IU]/mL — AB (ref 0.30–0.70)
HEPARIN UNFRACTIONATED: 0.75 [IU]/mL — AB (ref 0.30–0.70)
HEPARIN UNFRACTIONATED: 0.48 [IU]/mL (ref 0.30–0.70)

## 2015-11-09 LAB — PROTIME-INR
INR: 1.3
PROTHROMBIN TIME: 16.2 s — AB (ref 11.4–15.2)

## 2015-11-09 MED ORDER — WARFARIN - PHARMACIST DOSING INPATIENT
Freq: Every day | Status: DC
Start: 2015-11-09 — End: 2015-11-10

## 2015-11-09 MED ORDER — WARFARIN SODIUM 5 MG PO TABS
7.5000 mg | ORAL_TABLET | Freq: Once | ORAL | Status: AC
  Administered 2015-11-09: 7.5 mg via ORAL
  Filled 2015-11-09: qty 2

## 2015-11-09 MED ORDER — HEPARIN BOLUS VIA INFUSION
2500.0000 [IU] | Freq: Once | INTRAVENOUS | Status: AC
  Administered 2015-11-09: 2500 [IU] via INTRAVENOUS
  Filled 2015-11-09: qty 2500

## 2015-11-09 NOTE — Progress Notes (Signed)
ANTICOAGULATION CONSULT NOTE - FOLLOW UP    HL = 0.48 (goal 0.3 - 0.7 units/mL) Heparin dosing weight = 68 kg   Assessment: 55 YOM with recurrent DVTs/PEs to continue on IV heparin.  Heparin level is therapeutic; no bleeding reported.   Plan: - Continue heparin gtt at 1550 units/hr - F/U AM labs   Leilanie Rauda D. Laney Potashang, PharmD, BCPS 11/09/2015, 4:24 PM

## 2015-11-09 NOTE — Progress Notes (Signed)
ANTICOAGULATION CONSULT NOTE - Follow Up Consult  Pharmacy Consult for Heparin Indication: pulmonary embolus  Allergies  Allergen Reactions  . Aspirin Hives and Rash    Patient Measurements: Height: 6' (182.9 cm) Weight: 150 lb 12.7 oz (68.4 kg) IBW/kg (Calculated) : 77.6   Vital Signs: Temp: 97.4 F (36.3 C) (09/22 0822) Temp Source: Oral (09/22 0822) BP: 96/66 (09/22 0822) Pulse Rate: 63 (09/22 0822)  Labs:  Recent Labs  11/08/15 0754 11/08/15 1049 11/08/15 1807 11/09/15 0221 11/09/15 0930  HGB  --   --  11.7* 12.1*  --   HCT  --   --  38.1* 40.4  --   PLT  --   --  281 292  --   HEPARINUNFRC 0.30  --  0.24* 0.17* 0.75*  CREATININE  --   --   --  1.02  --   TROPONINI <0.03 <0.03 <0.03  --   --     Estimated Creatinine Clearance: 80.1 mL/min (by C-G formula based on SCr of 1.02 mg/dL).   Medications:  Scheduled:  . azithromycin  250 mg Oral Daily  . diazepam  10 mg Oral BID  . pantoprazole  80 mg Oral Daily    Assessment: 55yo male with PE.  Large jump in heparin level after bolus and increased rate this AM.  Level was drawn a bit early and expect it is reflecting some of the bolus still.  No bleeding noted per d/w RN.  Goal of Therapy:  Heparin level 0.3-0.7 units/ml Monitor platelets by anticoagulation protocol: Yes   Plan:  Continue heparin at current rate Repeat Heparin level at 3PM Watch for s/s of bleeding  Marisue HumbleKendra Toryn Mcclinton, PharmD Clinical Pharmacist [facility]

## 2015-11-09 NOTE — Progress Notes (Signed)
*  PRELIMINARY RESULTS* Vascular Ultrasound Lower extremity venous duplex has been completed.  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 Acute DVT noted in the left distal femoral vein.    Called results to [place], RN   Farrel DemarkJill Eunice, RDMS, RVT  11/09/2015, 12:25 PM

## 2015-11-09 NOTE — Progress Notes (Signed)
PROGRESS NOTE [facility]   Allen Holt   ZOX:[id number] DOB: Oct 05, 1960  DOA: 11/08/2015 PCP: No primary care provider on file.   Brief Narrative:   55 year old male with PMH as below, which is significant for DVT following hip fracture. He has been off of coumadin for about a year now. He also has family history of clots. He presented to PCP 9/19 with complaints of 2-3 weeks of several syncopal episodes, generalized weakness and non-productive cough. Labs were checked and [facility] was [facility].  Initially in the ED he was hypotensive, which improved with IVF. EKG with some ST changes in several leads. CTA was done demonstrating large saddle embolus and some possible tree-in-bud appearance of RLL. [facility], patient started in IV heparin. PCCM consulted for possible direct thrombolysis but none recommended at this time. Patient had a Doppler ultrasound of the legs which was positive for DVT in the right mid to distal femoral vein and DVT in the left distal femoral vein.   Subjective:  Patient seen and examined at the bedsite this morning. Patient reported significant improvement in breathing. Denies chest pain, shortness of breath, and palpitations. Patient is currently on 2 L of oxygen supplementation but he feels that he doesn't need. No acute events overnight.   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 This could be the reason of acute emboli formation. I believe the patient will grant hematology workup that could be done as outpatient. Blood pressure has significantly improved with IV  fluids. - Continue Holding BP meds - Continue Heparin gtt, will transition to warfarin tonight. Will consult pharmacy for dosing - Continue with rest for now, will have PT eval tomorrow - Vascular surgery consulted in view of multiple clots in bilateral legs, may need an IVC filter in the future. - O2 supplement to keep sat above 92%  HTN - improving with IV fluids - hold all HTN meds due to hypotension  AKI - resolved, likely due to hypotension - Continue IV fluid  RLL PNA - doubt that this is a real pneumonia. Patient has been a febrile, white count is normal and no other signs of symptoms of infection. Patient was placed in Zpack will finish the course.  - Continue monitoring  DVT prophylaxis: Heparin gtt Code Status: Full Family Communication: No family in room Disposition: When medically stable anticipate to go home   Consultants:   Pulmonary  Vascular surgery  Procedures:   Doppler ultrasound LE  Antimicrobials:  Z-Pak 11/08/2015   Objective: Vitals:   11/09/15 0200 11/09/15 0400 11/09/15 0500 11/09/15 0822  BP: 94/60 106/76 91/60 96/66   Pulse: 70 64 64 63  Resp: 19 12 (!) 7 11  Temp:    97.4 F (36.3 C)  TempSrc:    Oral  SpO2: 93% 99% 92% 97%  Weight:      Height:        Intake/Output Summary (Last 24 hours) at 11/09/15 1248 Last data filed at 11/09/15 1000  Gross per 24 hour  Intake  1861 ml  Output              750 ml  Net             1111 ml   Filed Weights   11/08/15 0600  Weight: 68.4 kg (150 lb 12.7 oz)    Examination:  General exam: Appears calm and comfortable  HEENT: AC/AT, PERRLA, OP moist and clear Respiratory system: Clear to auscultation. No wheezes,crackle or rhonchi Cardiovascular system: S1 & S2 heard, RRR. No JVD, murmurs, rubs or gallops Gastrointestinal system: Abdomen is nondistended, soft and nontender. No organomegaly or masses felt. Normal bowel sounds heard. Central nervous system: Alert and oriented. No  focal neurological deficits. Extremities: No pedal edema. Symmetric, strength 5/5   Skin: No rashes, lesions or ulcers Psychiatry: Judgement and insight appear normal. Mood & affect appropriate.    Data Reviewed: I have personally reviewed following labs and imaging studies  CBC:  Recent Labs Lab 11/08/15 1807 11/09/15 0221  WBC 5.7 6.1  HGB 11.7* 12.1*  HCT 38.1* 40.4  MCV 92.3 93.7  PLT 281 292   Basic Metabolic Panel:  Recent Labs Lab 11/09/15 0221  NA 139  K 4.2  CL 105  CO2 27  GLUCOSE 116*  BUN 11  CREATININE 1.02  CALCIUM 8.3*   GFR: Estimated Creatinine Clearance: 80.1 mL/min (by C-G formula based on SCr of 1.02 mg/dL). Liver Function Tests: No results for input(s): AST, ALT, ALKPHOS, BILITOT, PROT, ALBUMIN in the last 168 hours. No results for input(s): LIPASE, AMYLASE in the last 168 hours. No results for input(s): AMMONIA in the last 168 hours. Coagulation Profile: No results for input(s): INR, PROTIME in the last 168 hours. Cardiac Enzymes:  Recent Labs Lab 11/08/15 0754 11/08/15 1049 11/08/15 1807  TROPONINI <0.03 <0.03 <0.03   BNP (last 3 results) No results for input(s): PROBNP in the last 8760 hours. HbA1C: No results for input(s): HGBA1C in the last 72 hours. CBG: No results for input(s): GLUCAP in the last 168 hours. Lipid Profile: No results for input(s): CHOL, HDL, LDLCALC, TRIG, CHOLHDL, LDLDIRECT in the last 72 hours. Thyroid Function Tests: No results for input(s): TSH, T4TOTAL, FREET4, T3FREE, THYROIDAB in the last 72 hours. Anemia Panel: No results for input(s): VITAMINB12, FOLATE, FERRITIN, TIBC, IRON, RETICCTPCT in the last 72 hours. Sepsis Labs: No results for input(s): PROCALCITON, LATICACIDVEN in the last 168 hours.  Recent Results (from the past 240 hour(s))  MRSA PCR Screening     Status: None   Collection Time: 11/08/15  5:22 AM  Result Value Ref Range Status   MRSA by PCR NEGATIVE NEGATIVE Final    Comment:         The GeneXpert MRSA Assay (FDA approved for NASAL specimens only), is one component of a comprehensive MRSA colonization surveillance program. It is not intended to diagnose MRSA infection nor to guide or monitor treatment for MRSA infections.      Radiology Studies: No results found.   Scheduled Meds: . azithromycin  250 mg Oral Daily  . diazepam  10 mg Oral BID  . pantoprazole  80 mg Oral Daily   Continuous Infusions: . sodium chloride 125 mL/hr at 11/09/15 0820  . heparin 1,550 Units/hr (11/09/15 1128)     LOS: 1 day   Latrelle Dodrill, MD [facility] Pager [phone]  If 7PM-7AM, please contact night-coverage [email or web address] Password [facility] 11/09/2015, 12:48 PM

## 2015-11-09 NOTE — Progress Notes (Signed)
MD made aware of the result of the vascular ultrasound.

## 2015-11-09 NOTE — Progress Notes (Signed)
ANTICOAGULATION CONSULT NOTE - Initial Consult  Pharmacy Consult for warfarin (remains on IV heparin) Indication: pulmonary embolus  Allergies  Allergen Reactions  . Aspirin Hives and Rash    Patient Measurements: Height: 6' (182.9 cm) Weight: 150 lb 12.7 oz (68.4 kg) IBW/kg (Calculated) : 77.6  Vital Signs: Temp: 97.7 F (36.5 C) (09/22 1316) Temp Source: Oral (09/22 1316) BP: 104/63 (09/22 1316) Pulse Rate: 69 (09/22 1316)  Labs:  Recent Labs  11/08/15 0754 11/08/15 1049 11/08/15 1807 11/09/15 0221 11/09/15 0930  HGB  --   --  11.7* 12.1*  --   HCT  --   --  38.1* 40.4  --   PLT  --   --  281 292  --   HEPARINUNFRC 0.30  --  0.24* 0.17* 0.75*  CREATININE  --   --   --  1.02  --   TROPONINI <0.03 <0.03 <0.03  --   --     Estimated Creatinine Clearance: 80.1 mL/min (by C-G formula based on SCr of 1.02 mg/dL).   Medical History: Past Medical History:  Diagnosis Date  . Chronic pain   . 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 GERD (gastroesophageal reflux disease)   . HTN (hypertension)     Assessment: 55 year old male known to pharmacy for IV heparin dosing for recurrent DVT/PEs and family history of clots, now to restart oral Warfarin therapy - overlap day #1.   No INR available. No prior anticoagulation before admission- he has been off of Warfarin for about 1 year. No recent assessment of LFTs. Patient is on azithromycin x5 days. Otherwise, no other interacting medications.   Goal of Therapy:  INR 2-3 Monitor platelets by anticoagulation protocol: Yes   Plan:  Baseline INR now. Start Warfarin 7.5mg  po X1 at 1800. Consider evaluation of LFTs with new start Warfarin. Daily PT/INR while on therapy.  Continue heparin plan as prescribed and follow-up 1500 level.    Link SnufferJessica Keelynn Furgerson, PharmD, BCPS Clinical Pharmacist [phone] 11/09/2015,2:35 PM

## 2015-11-09 NOTE — Progress Notes (Signed)
ANTICOAGULATION CONSULT NOTE - Follow Up Consult  Pharmacy Consult for Heparin Indication: PE  Allergies  Allergen Reactions  . Aspirin Hives and Rash    Patient Measurements: Height: 6' (182.9 cm) Weight: 150 lb 12.7 oz (68.4 kg) IBW/kg (Calculated) : 77.6  Heparin dosing weight = 68 kg  Vital Signs: Temp: 97.5 F (36.4 C) (09/22 0025) Temp Source: Oral (09/22 0025) BP: 91/51 (09/22 0025) Pulse Rate: 73 (09/22 0025)  Labs:  Recent Labs  11/08/15 0754 11/08/15 1049 11/08/15 1807 11/09/15 0221  HGB  --   --  11.7* 12.1*  HCT  --   --  38.1* 40.4  PLT  --   --  281 292  HEPARINUNFRC 0.30  --  0.24* 0.17*  TROPONINI <0.03 <0.03 <0.03  --     CrCl cannot be calculated (Patient's most recent lab result is older than the maximum 21 days allowed.).   Assessment: 55yo male with PE and R-heart strain, large clot burden. On IV heparin. Heparin level down to 0.17 (subtherapeutic) on 1300 units/hr. No issues with line or bleeding reported per RN. CBC stable.  Goal of Therapy:  Heparin level 0.3-0.7 units/ml (aim for 0.5-0.7 in pt with PE) Monitor platelets by anticoagulation protocol: Yes   Plan:  Rebolus heparin 2500 units Increase heparin to 1550 units/hr F/u 6 hr heparin level  Christoper Fabianaron Mackinley Cassaday, PharmD, BCPS Clinical pharmacist, pager [phone] 11/09/2015 3:40 AM

## 2015-11-09 NOTE — Consult Note (Signed)
[facility] [place] CONSULT NOTE  VASCULAR SURGERY ASSESSMENT AND PLAN:  I have reviewed the chest CT scan with Dr. Lowella Dandy.  There is some evidence that he may have some old chronic pulmonary embolus, in addition to the acute clot.  His venous duplex scan of the lower extremities shows some chronic DVT in the right proximal femoral vein and some acute DVT in the right mid to distal femoral vein. On the left side, there is nonocclusive acute thrombus in the distal femoral vein.  The patient was not on anticoagulation prior to these events. Therefore, I do not see an indication for IVC filter placement. The clot in the right femoral vein appears to be chronic. He does have some acute clot distally on the right and some nonocclusive acute clot distally on the left. Therefore I do not see this as a large clot burden.  I would agree with the need for long-term anticoagulation. He would likely benefit from a hematology consult an approximate 3 months for a hypercoagulable workup. I would agree that it would not be best to get the hypercoagulable during this admission.   Given that he appears stable I would agree with critical care medicine that thrombolysis for his pulmonary embolus is not indicated unless his condition worsens.  Waverly Ferrari, MD, FACS Beeper [phone] Office: [phone]  Reason for Consult: PE and multiple DVTs Referring Physician: Lenox Ponds  History of Present Illness: 55 y/o male reported PE with chronic DVT and acute DVT on chronic coumadin.   Family history of blood clots in his father (unsure of exact clotting disorder).  Patient presented to his PCP 2 days ago with c/o 2-3 weeks of passing out.  Symptoms were persistent since onset, actually slightly improving.  Associated generalized weakness and some non-productive cough.  His PCP got labs, noted that his BP was running very low and he states his O2 SAT was low.   He was [facility] to [facility].  ED Course: In the ED at [facility], SBP running 90s, improved with IVF.  WBC 15k, creat of 1.8 (presumably acute given above history), sating 94% on room air, 98% on 2L.  EKG showed T wave inversions in 2,3,AVF, ST depression and T wave inversion in V2-V4.  CTA chest was performed and demonstrated large saddle embolus, as well as some tree-in-bud appearance in RLL ? aspiration vs PNA. He was transferred to [facility] 11/08/2015 on IV heparin.  History of HTN managed with Lisinopril and Metoprolol.  He denise DM or hypercholesterolemia.  He is on chronic pain management with his PCP for right hip s/p hip surgery.       Current Facility-Administered Medications  Medication Dose Route Frequency Provider Last Rate Last Dose  . 0.9 %  sodium chloride infusion   Intravenous Continuous Hillary Bow, DO 125 mL/hr at 11/09/15 0820    . azithromycin [facility]) tablet 250 mg  250 mg Oral Daily Hillary Bow, DO   250 mg at 11/09/15 0957  . diazepam (VALIUM) tablet 10 mg  10 mg Oral BID Hillary Bow, DO   10 mg at 11/09/15 0956  . heparin ADULT infusion 100 units/mL (25000 units/216mL sodium chloride 0.45%)  1,550 Units/hr Intravenous Continuous Titus Mould, RPH 15.5 mL/hr at 11/09/15 1128 1,550 Units/hr at 11/09/15 1128  . oxyCODONE (Oxy IR/ROXICODONE) immediate release tablet 20 mg  20 mg Oral Q6H PRN Hillary Bow, DO   20 mg at 11/09/15 0354  .  pantoprazole (PROTONIX) EC tablet 80 mg  80 mg Oral Daily Hillary Bow, DO   80 mg at 11/09/15 0957    Pt meds include: Statin :No Betablocker: Yes ASA: No Other anticoagulants/antiplatelets: coumadin  Past Medical History:  Diagnosis Date  . Chronic pain   . 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 GERD (gastroesophageal reflux disease)   . HTN (hypertension)     Past Surgical History:  Procedure Laterality Date  . TOTAL HIP ARTHROPLASTY     for hip fx    Social History Social History  Substance Use  Topics  . Smoking status: Never Smoker  . Smokeless tobacco: Not on file  . Alcohol use No    Family History Family History  Problem Relation Age of Onset  . Clotting disorder Father     Allergies  Allergen Reactions  . Aspirin Hives and Rash     REVIEW OF SYSTEMS  General: [ ]  Weight loss, [ ]  Fever, [ ]  chills Neurologic: [x ] Dizziness, [ ]  Blackouts, [ ]  Seizure [ ]  Stroke, [ ]  "Mini stroke", [ ]  Slurred speech, [ ]  Temporary blindness; [ ]  weakness in arms or legs, [ ]  Hoarseness [ ]  Dysphagia Cardiac: [ ]  Chest pain/pressure, [ ]  Shortness of breath at rest [ ]  Shortness of breath with exertion, [ ]  Atrial fibrillation or irregular heartbeat  Vascular: [ ]  Pain in legs with walking, [ ]  Pain in legs at rest, [ ]  Pain in legs at night,  [ ]  Non-healing ulcer, [x ] Blood clot in vein/DVT,   Pulmonary: [ ]  Home oxygen, [x ] Productive cough, [ ]  Coughing up blood, [ ]  Asthma,  [ ]  Wheezing [ ]  COPD Musculoskeletal:  [ ]  Arthritis, [ ]  Low back pain, [x ] Joint pain Hematologic: [ ]  Easy Bruising, [ ]  Anemia; [ ]  Hepatitis Gastrointestinal: [ ]  Blood in stool, [ ]  Gastroesophageal Reflux/heartburn, Urinary: [ ]  chronic Kidney disease, [ ]  on HD - [ ]  MWF or [ ]  TTHS, [ ]  Burning with urination, [ ]  Difficulty urinating Skin: [ ]  Rashes, [ ]  Wounds Psychological: [ ]  Anxiety, [ ]  Depression  Physical Examination Vitals:   11/09/15 0200 11/09/15 0400 11/09/15 0500 11/09/15 0822  BP: 94/60 106/76 91/60 96/66   Pulse: 70 64 64 63  Resp: 19 12 (!) 7 11  Temp:    97.4 F (36.3 C)  TempSrc:    Oral  SpO2: 93% 99% 92% 97%  Weight:      Height:       Body mass index is 20.45 kg/m.  General:  WDWN in NAD  HENT: WNL Eyes: Pupils equal Pulmonary: normal non-labored breathing on 2L O2 via [place] Cardiac: RRR, without  Murmurs, rubs or gallops; No carotid bruits Abdomen: soft, NT, no masses Skin: no rashes, ulcers noted;  no Gangrene , no cellulitis; no open wounds;    Vascular Exam/Pulses:Palpable radial, femoral, DP/PT 2+   Musculoskeletal: no muscle wasting or atrophy; no edema  Neurologic: A&O X 3; Appropriate Affect ;  SENSATION: normal; MOTOR FUNCTION: 5/5 Symmetric Speech is fluent/normal   Significant Diagnostic Studies: CBC Lab Results  Component Value Date   WBC 6.1 11/09/2015   HGB 12.1 (L) 11/09/2015   HCT 40.4 11/09/2015   MCV 93.7 11/09/2015   PLT 292 11/09/2015    BMET    Component Value Date/Time   NA 139 11/09/2015 0221   K 4.2 11/09/2015 0221  CL 105 11/09/2015 0221   CO2 27 11/09/2015 0221   GLUCOSE 116 (H) 11/09/2015 0221   BUN 11 11/09/2015 0221   CREATININE 1.02 11/09/2015 0221   CALCIUM 8.3 (L) 11/09/2015 0221   GFRNONAA >60 11/09/2015 0221   GFRAA >60 11/09/2015 0221   Estimated Creatinine Clearance: 80.1 mL/min (by C-G formula based on SCr of 1.02 mg/dL).  COAG Lab Results  Component Value Date   INR 1.33 12/22/2009   INR 2.94 (H) 12/18/2009   INR 2.85 (H) 12/17/2009     Non-Invasive Vascular Imaging:  *PRELIMINARY RESULTS* Vascular Ultrasound Lower extremity venous duplex has been completed.  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 Acute DVT noted in the left distal femoral vein.  Noted CTA showing PE large clot in the right main pulmonary artery with subsegmental clot in the left lower lobe  ASSESSMENT/PLAN:  PE large clot in the right main pulmonary artery with subsegmental clot in the left lower lobe DVT  Chronic DVT noted in the right proximal femoral vein, acute DVT noted in the right mid to distal femoral vein. Acute DVT noted in the left distal femoral vein.  Will discuss long term options for anticoagulation therapy and need for IVC filter and or thrombolysis.   Clinton GallantCOLLINS, EMMA [facility] 11/09/2015 1:06 PM

## 2015-11-09 NOTE — Consult Note (Addendum)
   Name: Allen Holt MRN: [id number] DOB: 1960-11-04    ADMISSION DATE:  11/08/2015 CONSULTATION DATE:  9/21   REFERRING MD :  Dr. Randel PiggSilva Zapata TR[facility]  CHIEF COMPLAINT:  PE  BRIEF PATIENT DESCRIPTION: 55 year old male with PMH of DVT after hip fx and family history of unknown clotting disorder. Has been off warfarin for about year and is now admitted with PE after 2-3 weeks syncope. Started on heparin. PCCM to see for ? EKOS.  SIGNIFICANT EVENTS   SUBJECTIVE:  Feels well, wants to go home today  VITAL SIGNS: Temp:  [96.4 F (35.8 C)-97.7 F (36.5 C)] 97.7 F (36.5 C) (09/22 1316) Pulse Rate:  [63-73] 69 (09/22 1316) Resp:  [7-19] 10 (09/22 1316) BP: (84-106)/(51-76) 104/63 (09/22 1316) SpO2:  [91 %-99 %] 96 % (09/22 1316)  PHYSICAL EXAMINATION: General:  Normal body habitus HENT: NCAT, OP clear PULM: CTA B, normal effort CV: RRR, slight systolic murmur, loud P2 Abdomen: Bowel sounds positive, nontender nondistended Extremities no clubbing cyanosis or edema Neurologic: Awake alert, oriented 4  Recent Labs Lab 11/09/15 0221  NA 139  K 4.2  CL 105  CO2 27  BUN 11  CREATININE 1.02  GLUCOSE 116*    Recent Labs Lab 11/08/15 1807 11/09/15 0221  HGB 11.7* 12.1*  HCT 38.1* 40.4  WBC 5.7 6.1  PLT 281 292   No results found.   Lower extremity Doppler ultrasound today reveals a chronic femoral vein clot in the right femoral vein with acute clot on that side, also acute clot in the left femoral vein.  Echocardiogram shows evidence of RV strain with mild to moderate RV dysfunction  ASSESSMENT / PLAN:  PE - large saddle embolus per CTA at [place], recurrent associated with RV strain but hemodynamically stable DVT Chronic on R, acute on left, non-occlusive Family hx clots   REC -  Continue O2 as needed to maintain O2 saturation greater than 90% Case discussed with Dr. Edilia Boickson who has seen the images from his lower extremity doppler ultrasound that showed  non-occlussive clot on the left and chronic clot on R No indication for IVC filter Given second lifetime clot, he will need anticoagulation lifelong OK to change to oral anticoagulation Will need repeat echo in 6 months  Heber CarolinaBrent McQuaid, MD [facility] Pager: [phone] Cell: [phone] After 3pm or if no response, call [phone]   11/09/2015 2:26 PM

## 2015-11-10 LAB — CBC
HCT: 36 % — ABNORMAL LOW (ref 39.0–52.0)
HEMOGLOBIN: 11 g/dL — AB (ref 13.0–17.0)
MCH: 28.6 pg (ref 26.0–34.0)
MCHC: 30.6 g/dL (ref 30.0–36.0)
MCV: 93.8 fL (ref 78.0–100.0)
PLATELETS: 232 10*3/uL (ref 150–400)
RBC: 3.84 MIL/uL — AB (ref 4.22–5.81)
RDW: 14.7 % (ref 11.5–15.5)
WBC: 5.1 10*3/uL (ref 4.0–10.5)

## 2015-11-10 LAB — HEPARIN LEVEL (UNFRACTIONATED): HEPARIN UNFRACTIONATED: 0.54 [IU]/mL (ref 0.30–0.70)

## 2015-11-10 LAB — PROTIME-INR
INR: 1.32
PROTHROMBIN TIME: 16.5 s — AB (ref 11.4–15.2)

## 2015-11-10 MED ORDER — DIAZEPAM 5 MG PO TABS
10.0000 mg | ORAL_TABLET | Freq: Two times a day (BID) | ORAL | Status: DC | PRN
  Administered 2015-11-10 – 2015-11-12 (×4): 10 mg via ORAL
  Filled 2015-11-10 (×4): qty 2

## 2015-11-10 MED ORDER — RIVAROXABAN 15 MG PO TABS
15.0000 mg | ORAL_TABLET | Freq: Two times a day (BID) | ORAL | Status: DC
  Administered 2015-11-10 – 2015-11-12 (×4): 15 mg via ORAL
  Filled 2015-11-10 (×4): qty 1

## 2015-11-10 NOTE — Progress Notes (Signed)
   VASCULAR SURGERY ASSESSMENT & PLAN:  No changes overnight.  Agree with plans for long-term anticoagulation. I think it would be worth getting a hypercoagulable workup at an approximate 3 months.  I have discussed the case with Dr. Kendrick FriesMcQuaid, and we agree that we will hold off on an IVC filter at this time.  I agree with critical care medicine also that thrombolysis is not indicated unless his condition worsens.  SUBJECTIVE: Resting comfortably  PHYSICAL EXAM: Vitals:   11/09/15 1747 11/09/15 2030 11/09/15 2311 11/10/15 0431  BP: 110/80 103/63 (!) 93/55 107/67  Pulse: 73 77 73 71  Resp: 15 14 10 12   Temp: 98 F (36.7 C) 98.1 F (36.7 C) 97.9 F (36.6 C) 97.4 F (36.3 C)  TempSrc: Oral Oral Oral Axillary  SpO2: 93% 95% 93% 91%  Weight:      Height:       Breathing nonlabored.  LABS: Lab Results  Component Value Date   WBC 5.1 11/10/2015   HGB 11.0 (L) 11/10/2015   HCT 36.0 (L) 11/10/2015   MCV 93.8 11/10/2015   PLT 232 11/10/2015   Lab Results  Component Value Date   CREATININE 1.02 11/09/2015   Lab Results  Component Value Date   INR 1.32 11/10/2015   CBG (last 3)  No results for input(s): GLUCAP in the last 72 hours.  Principal Problem:   Subacute massive pulmonary embolism (HCC) Active Problems:   HTN (hypertension)   AKI (acute kidney injury) (HCC)   Leukocytosis   Right lower lobe pneumonia   Allen CarawayChris Holt Beeper: [phone] 11/10/2015

## 2015-11-10 NOTE — Plan of Care (Signed)
Problem: Safety: Goal: Ability to remain free from injury will improve Outcome: Progressing Pt has not exhibited any unsafe behaviors and is consistently using his call bell when he is in need of assistance.

## 2015-11-10 NOTE — Progress Notes (Signed)
Called report to [employer] [facility]  RN on 2 west. Aware pt will be coming via W/C. Pt has all personal belongings including cell phone with him.

## 2015-11-10 NOTE — Discharge Instructions (Signed)
Information on my medicine - XARELTO (rivaroxaban)  This medication education was reviewed with me or my healthcare representative as part of my discharge preparation.  The pharmacist that spoke with me during my hospital stay was:  Renaee MundaHiatt, Decklyn Hornik P, [facility]  WHY WAS XARELTO PRESCRIBED FOR YOU? Xarelto was prescribed to treat blood clots that may have been found in the veins of your legs (deep vein thrombosis) or in your lungs (pulmonary embolism) and to reduce the risk of them occurring again.  What do you need to know about Xarelto? The starting dose is one 15 mg tablet taken TWICE daily with food for the FIRST 21 DAYS then on 12/02/15  the dose is changed to one 20 mg tablet taken ONCE A DAY with your evening meal.  DO NOT stop taking Xarelto without talking to the health care provider who prescribed the medication.  Refill your prescription for 20 mg tablets before you run out.  After discharge, you should have regular check-up appointments with your healthcare provider that is prescribing your Xarelto.  In the future your dose may need to be changed if your kidney function changes by a significant amount.  What do you do if you miss a dose? If you are taking Xarelto TWICE DAILY and you miss a dose, take it as soon as you remember. You may take two 15 mg tablets (total 30 mg) at the same time then resume your regularly scheduled 15 mg twice daily the next day.  If you are taking Xarelto ONCE DAILY and you miss a dose, take it as soon as you remember on the same day then continue your regularly scheduled once daily regimen the next day. Do not take two doses of Xarelto at the same time.   Important Safety Information Xarelto is a blood thinner medicine that can cause bleeding. You should call your healthcare provider right away if you experience any of the following: ? Bleeding from an injury or your nose that does not stop. ? Unusual colored urine (red or dark brown) or unusual colored  stools (red or black). ? Unusual bruising for unknown reasons. ? A serious fall or if you hit your head (even if there is no bleeding).  Some medicines may interact with Xarelto and might increase your risk of bleeding while on Xarelto. To help avoid this, consult your healthcare provider or pharmacist prior to using any new prescription or non-prescription medications, including herbals, vitamins, non-steroidal anti-inflammatory drugs (NSAIDs) and supplements.  This website has more information on Xarelto: [email or web address] on my medicine - XARELTO (rivaroxaban)

## 2015-11-10 NOTE — Progress Notes (Signed)
ANTICOAGULATION CONSULT NOTE - Initial Consult  Pharmacy Consult for Rivaroxaban Indication: pulmonary embolus  Allergies  Allergen Reactions  . Aspirin Hives and Rash    Patient Measurements: Height: 6' (182.9 cm) Weight: 150 lb 12.7 oz (68.4 kg) IBW/kg (Calculated) : 77.6 Heparin Dosing Weight:   Vital Signs: Temp: 98 F (36.7 C) (09/23 0812) Temp Source: Oral (09/23 0812) BP: 118/79 (09/23 0812) Pulse Rate: 65 (09/23 0812)  Labs:  Recent Labs  11/08/15 0754 11/08/15 1049  11/08/15 1807 11/09/15 0221 11/09/15 0930 11/09/15 1519 11/10/15 0633  HGB  --   --   < > 11.7* 12.1*  --   --  11.0*  HCT  --   --   --  38.1* 40.4  --   --  36.0*  PLT  --   --   --  281 292  --   --  232  LABPROT  --   --   --   --   --   --  16.2* 16.5*  INR  --   --   --   --   --   --  1.30 1.32  HEPARINUNFRC 0.30  --   --  0.24* 0.17* 0.75* 0.48 0.54  CREATININE  --   --   --   --  1.02  --   --   --   TROPONINI <0.03 <0.03  --  <0.03  --   --   --   --   < > = values in this interval not displayed.  Estimated Creatinine Clearance: 80.1 mL/min (by C-G formula based on SCr of 1.02 mg/dL).   Medical History: Past Medical History:  Diagnosis Date  . Chronic pain   . 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 GERD (gastroesophageal reflux disease)   . HTN (hypertension)     Medications:  Scheduled:  . azithromycin  250 mg Oral Daily  . pantoprazole  80 mg Oral Daily    Assessment: 55yo with PE, on Heparin & Coumadin- now to change to Xarelto.  Pt has had only 1 dose of Coumadin, INR 1.32 this AM.  Hg is stable & pltc wnl.  No bleeding noted.  Goal of Therapy:  Resolution of PE Monitor platelets by anticoagulation protocol: Yes   Plan:  Xarelto 15mg  bid x 21 days, then 20mg  qday- 1st dose with dinner D/C heparin with 1st dose Xarelto D/C all heparin labs, INR Watch for bleeding   Marisue HumbleKendra Tatia Petrucci, PharmD Clinical Pharmacist [facility]  [facility]

## 2015-11-10 NOTE — Progress Notes (Signed)
PROGRESS NOTE [facility]   Allen Holt   AVW:[id number] DOB: 08/06/1960  DOA: 11/08/2015 PCP: No primary care provider on file.   Brief Narrative:   55 year old male with PMH as below, which is significant for DVT following hip fracture. He has been off of coumadin for about a year now. He also has family history of clots. He presented to PCP 9/19 with complaints of 2-3 weeks of several syncopal episodes, generalized weakness and non-productive cough. Labs were checked and he was sent to [facility] ED for AKI.  Initially in the ED he was hypotensive, which improved with IVF. EKG with some ST changes in several leads. CTA was done demonstrating large saddle embolus and some possible tree-in-bud appearance of RLL. He was transferred to [facility] for further evaluation, patient started in IV heparin. PCCM consulted for possible direct thrombolysis but none recommended at this time. Patient had a Doppler ultrasound of the legs which was positive for DVT in the right mid to distal femoral vein and DVT in the left distal femoral vein. Now been transitioned from heparin to oral anticoagulation.  Subjective:  Patient seen and examined at the bedsite this morning. No acute events overnight. Patient breathing continued to improve. Has been moved out of bed with no significant desaturation. Patient concerned because he was started on warfarin, and will be difficult for him to to monitor his INR, given the fact that his doctor is one hour away from his house, he also doesn't have transportation. No complaints for today  Assessment & Plan:   Subacute massive PE in the setting of bilateral acute femoral vein DVT-  patient have history of DVTs in the past, was on Coumadin but stopped taking it for about a year now. This could be the reason of acute emboli formation. I believe the patient will grant hematology workup that could be done as outpatient. Blood pressure has significantly improved with IV  fluids.  Discussed with patient different and methods of anticoagulation, pros and cons. The concern will be that patient cannot checks his INR frequently given his PCP location. Explained that we have some new agents that can be used and does not need any monitoring. Also discussed that this new agents does not have any reverse mechanism. Patient understand and agrees to start with a NOAC which will allow him to be more compliant with the medication.  - Transferred to telemetry floor  - Continue Holding BP meds - Stop warfarin - pharmacy consult for xarelto transition - Continue Heparin gtt, stop Xarelto - Out of bed, PT eval check O2 with ambulation - Vascular surgery consulted - note seen and appreciated - PCCM recommendations appreciated - O2 supplement to keep sat above 92%  HTN - improving with IV fluids - hold all HTN meds due to hypotension  AKI - resolved, likely due to hypotension - Continue IV fluid  RLL PNA - doubt that this is a real pneumonia. Patient has been a febrile, white count is normal and no other signs of symptoms of infection. Patient was placed in Zpack will finish the course.  - Continue monitoring  DVT prophylaxis: Heparin gtt - transition Xarelto tonight Code Status: Full Family Communication: No family in room Disposition: When medically stable anticipate to go home in 24-48 hours   Consultants:   Pulmonary  Vascular surgery  Procedures:   Doppler ultrasound LE  Antimicrobials:  Z-Pak 11/08/2015   Objective: Vitals:   11/09/15 2030 11/09/15 2311 11/10/15 8295 11/10/15 6213  BP: 103/63 (!) 93/55 107/67 118/79  Pulse: 77 73 71 65  Resp: 14 10 12 19   Temp: 98.1 F (36.7 C) 97.9 F (36.6 C) 97.4 F (36.3 C) 98 F (36.7 C)  TempSrc: Oral Oral Axillary Oral  SpO2: 95% 93% 91% 98%  Weight:      Height:        Intake/Output Summary (Last 24 hours) at 11/10/15 1056 Last data filed at 11/10/15 0400  Gross per 24 hour  Intake              2519 ml  Output             1350 ml  Net             1169 ml   Filed Weights   11/08/15 0600  Weight: 68.4 kg (150 lb 12.7 oz)    Examination:  General exam: Appears calm and comfortable  Respiratory system: Clear to auscultation. No wheezes,crackle or rhonchi Cardiovascular system: S1 & S2 heard, RRR. No JVD, murmurs, rubs or gallops Gastrointestinal system: Abdomen is nondistended, soft and nontender. No organomegaly or masses felt. Central nervous system: Alert and oriented. No focal neurological deficits. Extremities: No pedal edema. Symmetric, strength 5/5, Homans signs negative Skin: No rashes, lesions or ulcers Psychiatry: Judgement and insight appear normal. Mood & affect appropriate.    Data Reviewed: I have personally reviewed following labs and imaging studies  CBC:  Recent Labs Lab 11/08/15 1807 11/09/15 0221 11/10/15 0633  WBC 5.7 6.1 5.1  HGB 11.7* 12.1* 11.0*  HCT 38.1* 40.4 36.0*  MCV 92.3 93.7 93.8  PLT 281 292 232   Basic Metabolic Panel:  Recent Labs Lab 11/09/15 0221  NA 139  K 4.2  CL 105  CO2 27  GLUCOSE 116*  BUN 11  CREATININE 1.02  CALCIUM 8.3*   GFR: Estimated Creatinine Clearance: 80.1 mL/min (by C-G formula based on SCr of 1.02 mg/dL). Liver Function Tests: No results for input(s): AST, ALT, ALKPHOS, BILITOT, PROT, ALBUMIN in the last 168 hours. No results for input(s): LIPASE, AMYLASE in the last 168 hours. No results for input(s): AMMONIA in the last 168 hours. Coagulation Profile:  Recent Labs Lab 11/09/15 1519 11/10/15 0633  INR 1.30 1.32   Cardiac Enzymes:  Recent Labs Lab 11/08/15 0754 11/08/15 1049 11/08/15 1807  TROPONINI <0.03 <0.03 <0.03   BNP (last 3 results) No results for input(s): PROBNP in the last 8760 hours. HbA1C: No results for input(s): HGBA1C in the last 72 hours. CBG: No results for input(s): GLUCAP in the last 168 hours. Lipid Profile: No results for input(s): CHOL, HDL, LDLCALC, TRIG,  CHOLHDL, LDLDIRECT in the last 72 hours. Thyroid Function Tests: No results for input(s): TSH, T4TOTAL, FREET4, T3FREE, THYROIDAB in the last 72 hours. Anemia Panel: No results for input(s): VITAMINB12, FOLATE, FERRITIN, TIBC, IRON, RETICCTPCT in the last 72 hours. Sepsis Labs: No results for input(s): PROCALCITON, LATICACIDVEN in the last 168 hours.  Recent Results (from the past 240 hour(s))  MRSA PCR Screening     Status: None   Collection Time: 11/08/15  5:22 AM  Result Value Ref Range Status   MRSA by PCR NEGATIVE NEGATIVE Final    Comment:        The GeneXpert MRSA Assay (FDA approved for NASAL specimens only), is one component of a comprehensive MRSA colonization surveillance program. It is not intended to diagnose MRSA infection nor to guide or monitor treatment for MRSA infections.  Radiology Studies: No results found.   Scheduled Meds: . azithromycin  250 mg Oral Daily  . pantoprazole  80 mg Oral Daily   Continuous Infusions: . sodium chloride 125 mL/hr at 11/10/15 1021  . heparin 1,550 Units/hr (11/10/15 0512)     LOS: 2 days   Latrelle Dodrill, MD [facility] Pager [phone]  If 7PM-7AM, please contact night-coverage [email or web address] Password [place] 11/10/2015, 10:56 AM

## 2015-11-11 MED ORDER — GUAIFENESIN ER 600 MG PO TB12
600.0000 mg | ORAL_TABLET | Freq: Two times a day (BID) | ORAL | Status: DC | PRN
Start: 2015-11-11 — End: 2015-11-12
  Administered 2015-11-11: 600 mg via ORAL
  Filled 2015-11-11: qty 1

## 2015-11-11 NOTE — Progress Notes (Signed)
SATURATION QUALIFICATIONS: (This note is used to comply with regulatory documentation for home oxygen)  Patient Saturations on Room Air at Rest = 96%  Patient Saturations on Room Air while Ambulating = 96% Please briefly explain why patient needs home oxygen: Pt doesn't need oxygen  [facility] Dakoda [place] PT [phone]

## 2015-11-11 NOTE — Progress Notes (Addendum)
   VASCULAR SURGERY:  On Xarelto.   Agree with plans for long-term anticoagulation. I think it would be worth getting a hypercoagulable workup at an approximate 3 months.  Vascular will be available as needed.   Waverly Ferrarihristopher Rilea Arutyunyan, MD, FACS Beeper [phone] Office: [phone]  Comfortable without changes.  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 Acute DVT noted in the left distal femoral vein.  Pending anticoagulation  COLLINS, EMMA MAUREEN PA-C  I have interviewed the patient and examined the patient. I agree with the findings by the PA.  Cari Carawayhris Rexine Gowens, MD [phone]

## 2015-11-11 NOTE — Progress Notes (Signed)
PROGRESS NOTE [facility]   Allen GalaKevin L Holt   ZOX:[id number] DOB: 03/01/1960  DOA: 11/08/2015 PCP: No primary care provider on file.   Brief Narrative:   55 year old male with PMH as below, which is significant for DVT following hip fracture. He has been off of coumadin for about a year now. He also has family history of clots. He presented to PCP 9/19 with complaints of 2-3 weeks of several syncopal episodes, generalized weakness and non-productive cough. Labs were checked and he was sent [facility] ED for [facility].  Initially in the ED he was hypotensive, which improved with IVF. EKG with some ST changes in several leads. CTA was done demonstrating large saddle embolus and some possible tree-in-bud appearance of RLL. [facility], patient started in IV heparin. PCCM consulted for possible direct thrombolysis but none recommended at this time. Patient had a Doppler ultrasound of the legs which was positive for DVT in the right mid to distal femoral vein and DVT in the left distal femoral vein. Now been transitioned from heparin to oral anticoagulation. Improving.   Subjective:  Patient seen and examined at the bedsite this morning. No acute events overnight. Patient breathing continued to improve. Only c/o mild productive cough that is been ongoing for the past 2-3 days. No fever. Denies chest pain, shortness of breath and palpitations   Assessment & Plan:   Subacute massive PE in the setting of bilateral acute femoral vein DVT-  patient have history of DVTs in the past, was on Coumadin but stopped taking it for about a year now. This could be the reason of acute emboli formation. I believe the patient will grant hematology workup that could be done as outpatient. Blood pressure has significantly improved with IV fluids.  Discussed with patient different and methods of anticoagulation, pros and cons. The concern will be that patient cannot checks his INR  frequently given his PCP location. Explained that we have some new agents that can be used and does not need any monitoring. Also discussed that this new agents does not have any reverse mechanism. Patient understand and agrees to start with a NOAC which will allow him to be more compliant with the medication.  - Continue Xarelto - Out of bed, PT eval check O2 with ambulation - Vascular surgery recommendation appreciated - PCCM recommendations appreciated - O2 supplement to keep sat above 92%  RLL PNA - ? PNA, given initial presentation although patient has been having a productive cough during the hospital stay. At this point afebrile, VS stale - Will finish Z pack   - Continue monitoring  HTN - initially hypotensive due to PE holding BP meds, now BP back to normal levels  - If become hypertensive will resume home BP meds - Monitor BP   AKI - resolved, likely due to hypotension  DVT prophylaxis: Xarelto  Code Status: Full Family Communication: No family in room Disposition: Patient to walk around with PT to check O2 sat on exertion, plan to d/c tomorrow in AM   Consultants:   Pulmonary  Vascular surgery  Procedures:   Doppler ultrasound LE  Antimicrobials:  Z-Pak 11/08/2015 - 11/11/15   Objective: Vitals:   11/10/15 1651 11/10/15 1957 11/11/15 0539 11/11/15 0545  BP: 136/86 100/63 139/87   Pulse: 80 80 78   Resp: 11 12    Temp: 97.7 F (36.5 C) 97.8 F (36.6 C) 98.4 F (36.9 C)   TempSrc: Oral Oral  Axillary   SpO2: 96% 90% (!) 81% (!) 60%  Weight:      Height:        Intake/Output Summary (Last 24 hours) at 11/11/15 0845 Last data filed at 11/10/15 2027  Gross per 24 hour  Intake           1643.5 ml  Output              300 ml  Net           1343.5 ml   Filed Weights   11/08/15 0600  Weight: 68.4 kg (150 lb 12.7 oz)    Examination:  General exam: Appears calm and comfortable  Respiratory system: Clear to auscultation. No wheezes,crackle or  rhonchi Cardiovascular system: S1 & S2 heard, RRR. murmurs, rubs or gallops Gastrointestinal system: Abdomen is nondistended, soft and nontender. Central nervous system: Alert and oriented. No focal neurological deficits. Extremities: No pedal edema. Symmetric, Homans signs negative Skin: No rashes, lesions or ulcers Psychiatry: Judgement and insight appear normal. Mood & affect appropriate.    Data Reviewed: I have personally reviewed following labs and imaging studies  CBC:  Recent Labs Lab 11/08/15 1807 11/09/15 0221 11/10/15 0633  WBC 5.7 6.1 5.1  HGB 11.7* 12.1* 11.0*  HCT 38.1* 40.4 36.0*  MCV 92.3 93.7 93.8  PLT 281 292 232   Basic Metabolic Panel:  Recent Labs Lab 11/09/15 0221  NA 139  K 4.2  CL 105  CO2 27  GLUCOSE 116*  BUN 11  CREATININE 1.02  CALCIUM 8.3*   GFR: Estimated Creatinine Clearance: 80.1 mL/min (by C-G formula based on SCr of 1.02 mg/dL). Liver Function Tests: No results for input(s): AST, ALT, ALKPHOS, BILITOT, PROT, ALBUMIN in the last 168 hours. No results for input(s): LIPASE, AMYLASE in the last 168 hours. No results for input(s): AMMONIA in the last 168 hours. Coagulation Profile:  Recent Labs Lab 11/09/15 1519 11/10/15 0633  INR 1.30 1.32   Cardiac Enzymes:  Recent Labs Lab 11/08/15 0754 11/08/15 1049 11/08/15 1807  TROPONINI <0.03 <0.03 <0.03   BNP (last 3 results) No results for input(s): PROBNP in the last 8760 hours. HbA1C: No results for input(s): HGBA1C in the last 72 hours. CBG: No results for input(s): GLUCAP in the last 168 hours. Lipid Profile: No results for input(s): CHOL, HDL, LDLCALC, TRIG, CHOLHDL, LDLDIRECT in the last 72 hours. Thyroid Function Tests: No results for input(s): TSH, T4TOTAL, FREET4, T3FREE, THYROIDAB in the last 72 hours. Anemia Panel: No results for input(s): VITAMINB12, FOLATE, FERRITIN, TIBC, IRON, RETICCTPCT in the last 72 hours. Sepsis Labs: No results for input(s):  PROCALCITON, LATICACIDVEN in the last 168 hours.  Recent Results (from the past 240 hour(s))  MRSA PCR Screening     Status: None   Collection Time: 11/08/15  5:22 AM  Result Value Ref Range Status   MRSA by PCR NEGATIVE NEGATIVE Final    Comment:        The GeneXpert MRSA Assay (FDA approved for NASAL specimens only), is one component of a comprehensive MRSA colonization surveillance program. It is not intended to diagnose MRSA infection nor to guide or monitor treatment for MRSA infections.      Radiology Studies: No results found.   Scheduled Meds: . azithromycin  250 mg Oral Daily  . pantoprazole  80 mg Oral Daily  . Rivaroxaban  15 mg Oral BID WC   Continuous Infusions: . sodium chloride 125 mL/hr at 11/11/15 0158  LOS: 3 days   Latrelle Dodrill, MD [facility] Pager [phone]  If 7PM-7AM, please contact night-coverage [email or web address] Password [facility] 11/11/2015, 8:45 AM

## 2015-11-11 NOTE — Evaluation (Signed)
Physical Therapy Evaluation Patient Details Name: Allen Holt MRN: [id number] DOB: 02-26-1960 Today's Date: 11/11/2015   History of Present Illness  Pt adm with bil PE and bil DVT's. PMH - htn, hip fx, DVT  Clinical Impression  Pt doing well with mobility and no further PT needed.  Ready for dc from PT standpoint. SpO2 96% on RA with activity.      Follow Up Recommendations No PT follow up    Equipment Recommendations  None recommended by PT    Recommendations for Other Services       Precautions / Restrictions Precautions Precautions: None Restrictions Weight Bearing Restrictions: No      Mobility  Bed Mobility Overal bed mobility: Independent                Transfers Overall transfer level: Independent                  Ambulation/Gait Ambulation/Gait assistance: Modified independent (Device/Increase time) Ambulation Distance (Feet): 300 Feet Assistive device: None Gait Pattern/deviations: Step-through pattern;Decreased stride length Gait velocity: decr Gait velocity interpretation: Below normal speed for age/gender General Gait Details: Steady but slow. SpO2 96% on RA with amb  Stairs            Wheelchair Mobility    Modified Rankin (Stroke Patients Only)       Balance Overall balance assessment: Modified Independent                                           Pertinent Vitals/Pain Pain Assessment: No/denies pain    Home Living Family/patient expects to be discharged to:: Private residence Living Arrangements: Alone             Home Equipment: None      Prior Function Level of Independence: Independent               Hand Dominance        Extremity/Trunk Assessment   Upper Extremity Assessment: Overall WFL for tasks assessed           Lower Extremity Assessment: Overall WFL for tasks assessed         Communication   Communication: No difficulties  Cognition  Arousal/Alertness: Awake/alert Behavior During Therapy: WFL for tasks assessed/performed Overall Cognitive Status: Within Functional Limits for tasks assessed                      General Comments      Exercises     Assessment/Plan    PT Assessment Patent does not need any further PT services  PT Problem List            PT Treatment Interventions      PT Goals (Current goals can be found in the Care Plan section)  Acute Rehab PT Goals PT Goal Formulation: All assessment and education complete, DC therapy    Frequency     Barriers to discharge        Co-evaluation               End of Session   Activity Tolerance: Patient tolerated treatment well Patient left: in bed;with call bell/phone within reach           Time: 1209-1223 PT Time Calculation (min) (ACUTE ONLY): 14 min   Charges:   PT Evaluation $PT Eval Low Complexity: 1 Procedure  PT G Codes:        Theordore Cisnero 11/11/2015, 1:15 PM [facility] Dilraj Killgore PT [phone]

## 2015-11-12 ENCOUNTER — Encounter (HOSPITAL_COMMUNITY): Payer: Self-pay | Admitting: Family

## 2015-11-12 MED ORDER — GUAIFENESIN ER 600 MG PO TB12: 600.0000 mg | ORAL_TABLET | Freq: Two times a day (BID) | ORAL | 0 refills | Status: AC | PRN

## 2015-11-12 MED ORDER — AZITHROMYCIN 250 MG PO TABS: 250.0000 mg | ORAL_TABLET | Freq: Every day | ORAL | 0 refills | Status: AC

## 2015-11-12 MED ORDER — RIVAROXABAN 15 MG PO TABS: 15.0000 mg | ORAL_TABLET | Freq: Two times a day (BID) | ORAL | 1 refills | Status: DC

## 2015-11-12 MED ORDER — RIVAROXABAN (XARELTO) EDUCATION KIT FOR DVT/PE PATIENTS: 1.0000 | PACK | Freq: Once | 0 refills | Status: AC

## 2015-11-12 MED ORDER — RIVAROXABAN 15 MG PO TABS: 15.0000 mg | ORAL_TABLET | Freq: Two times a day (BID) | ORAL | 0 refills | Status: AC

## 2015-11-12 NOTE — Progress Notes (Signed)
Per Theatre stage managernsurance check for [employer]W [employer] @  [employer] # [phone]  POLICY # [id number]   XARELTO 20 MG DAILY ( 30 ) 30 TAB   COVER- YES  CO-PAY- $ 8.25  TIER- 3 DRUG  PRIOR APPROVAL - NO  PHARMACY : [facility], [facility] AND [facility]   *IF PATIENT GO OVER 30 WILL NEED PRIOR APPROVAL #   [id number] *

## 2015-11-12 NOTE — Care Management Note (Signed)
Case Management Note Donn PieriniKristi Tesa Meadors RN, BSN Unit 2W-Case Manager [phone]  Patient Details  Name: Allen GalaKevin L Stokke MRN: [id number] Date of Birth: Aug 11, 1960  Subjective/Objective:  Pt admitted PE/DVT                  Action/Plan: PTA pt lived at home- independent- referral for Xarelto needs- Per pt his medication coverage is with [employer]- per insurance check- S/W [employer] @  [employer] # [id number]  POLICY # [id number]   XARELTO 20 MG DAILY ( 30 ) 30 TAB   COVER- YES  CO-PAY- $ 8.25  TIER- 3 DRUG  PRIOR APPROVAL - NO  PHARMACY : [facility] AND [facility]   *IF PATIENT GO OVER 30 WILL NEED PRIOR APPROVAL #   [phone] *    Spoke with pt and his mother at bedside- per conversation [facility] [place]- call made to pharmacy to check and see if they had starter pack in stock- [facility] that [facility] not and would have to order- they only have 20 mg strength in stock. Pt given- info - along with info on [facility] on [address] that has starter pack in stock- pt has 30 day free card to use on discharge.   Expected Discharge Date: 11/12/15               Expected Discharge Plan:  Home/Self Care  In-House Referral:     Discharge planning Services  CM Consult, Medication Assistance  Post Acute Care Choice:    Choice offered to:     DME Arranged:    DME Agency:     HH Arranged:    HH Agency:     Status of Service:  Completed, signed off  If discussed at Long Length of Stay Meetings, dates discussed:    Discharge Disposition: Home/self care   Additional Comments:  Darrold SpanWebster, Primitivo Merkey Hall, RN 11/12/2015, 12:20 PM

## 2015-11-12 NOTE — Care Management Important Message (Signed)
Important Message  Patient Details  Name: Allen Holt MRN: [id number] Date of Birth: Jan 08, 1961   Medicare Important Message Given:  Yes    [name] 11/12/2015, 10:59 AM

## 2015-11-12 NOTE — Progress Notes (Signed)
Discussed with the patient and his mother - all questioned fully answered. Given paper prescriptions for all medications. IV removed, telemetry removed. Educated on antibiotics, anticoagulant, and follow up appointments.   Leonidas Rombergaitlin S Bumbledare, RN

## 2015-11-12 NOTE — Discharge Summary (Signed)
Physician Discharge Summary  Allen Holt  ZOX:[id number]  DOB: July 25, 1960  DOA: 11/08/2015 PCP: No primary care provider on file.  Admit date: 11/08/2015 Discharge date: 11/12/2015  Admitted From: Home   Disposition:  Home   Recommendations for Outpatient Follow-up:  1. Follow up with PCP in 1 week 2. Please obtain BMP/CBC in one week 3. Hypercoagulation work up in 3 weeks  4. Repeat ECHO in 6 months   Home Health: None  Equipment/Devices: None   Discharge Condition: Stable  CODE STATUS: Full  Diet recommendation: Heart Healthy   Brief/Interim Summary:  55 year old male with PMH as below, which is significant for DVT following hip fracture. He has been off coumadin for about a year now. He also has family history of clots. He presented to PCP 9/19 with complaints of 2-3 weeks of several syncopal episodes, generalized weakness and non-productive cough. Labs were checked and he was sent to [facility] ED for AKI. Initially in the ED he was hypotensive, which improved with IVF. EKG with some ST changes in several leads. CTA was done demonstrating large saddle embolus and some possible tree-in-bud appearance of RLL. He was transferred to [facility] for [facility] evaluation, patient started in IV heparin. PCCM consulted for possible direct thrombolysis but none recommended at this time. Patient had a Doppler ultrasound of the legs which was positive for DVT in the right mid to distal femoral vein and DVT in the left distal femoral vein. Was transitioned from heparin to oral anticoagulation (xarelto). Patient breathing room air maintaining saturations above 93% at rest and with ambulation. Patient will be discharge home today with follow up with his PMD.   Subjective:  Patient seen and examined at bedside this morning. Patient have no complaints today. No acute events overnight.   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 This could be the reason of acute emboli formation. I believe the patient will grant hematology workup that could be done as outpatient.   - Continue Xarelto - life long treatment  - Hypercoagulable work up in 3 weeks  - Repeat ECHO in 6 months  - Follow with PMD    RLL PNA - ? PNA, given initial presentation although patient has been having a productive cough during the hospital stay. At this point afebrile, VS stale - Zpack - last day will be on 11/13/15 - Mucinex as needed  HTN - now normotensive  - Lisinopril resumed  - Metoprolol on hold - to discuss with PMD when to resume medication   AKI - Cr back to baseline - was likely due to hypotension   Discharge Instructions  Discharge Instructions    Call MD for:  difficulty breathing, headache or visual disturbances    Complete by:  As directed    Call MD for:  extreme fatigue    Complete by:  As directed    Call MD for:  hives    Complete by:  As directed    Call MD for:  persistant dizziness or light-headedness    Complete by:  As directed    Call MD for:  persistant nausea and vomiting    Complete by:  As directed    Call MD for:  redness, tenderness, or signs of infection (pain, swelling, redness, odor or green/yellow discharge around incision site)  Complete by:  As directed    Call MD for:  severe uncontrolled pain    Complete by:  As directed    Call MD for:  temperature >100.4    Complete by:  As directed    Diet - low sodium heart healthy    Complete by:  As directed    Discharge instructions    Complete by:  As directed    Discharge instructions    Complete by:  As directed    Follow with PMD in 1 week Get lab work up in 3 weeks   Increase activity slowly    Complete by:  As directed        Medication List    STOP taking these medications   metoprolol succinate 100 MG 24 hr tablet Commonly known as:  TOPROL-XL    naproxen 500 MG tablet Commonly known as:  NAPROSYN   ranitidine 150 MG tablet Commonly known as:  ZANTAC     TAKE these medications   azithromycin 250 MG tablet Commonly known as:  ZITHROMAX Take 1 tablet (250 mg total) by mouth daily. What changed:  how much to take  additional instructions   diazepam 10 MG tablet Commonly known as:  VALIUM Take 10 mg by mouth daily. For shaking   doxepin 150 MG capsule Commonly known as:  SINEQUAN Take 150 mg by mouth at bedtime. For anxiety   esomeprazole 40 MG capsule Commonly known as:  NEXIUM Take 40 mg by mouth daily.   guaiFENesin 600 MG 12 hr tablet Commonly known as:  MUCINEX Take 1 tablet (600 mg total) by mouth 2 (two) times daily as needed for cough.   lisinopril 10 MG tablet Commonly known as:  PRINIVIL,ZESTRIL Take 10 mg by mouth daily.   LYRICA 50 MG capsule Generic drug:  pregabalin Take 50 mg by mouth 3 (three) times daily.   Oxycodone HCl 20 MG Tabs Take 20 mg by mouth 4 (four) times daily as needed for pain.   Rivaroxaban 15 MG Tabs tablet Commonly known as:  XARELTO Take 1 tablet (15 mg total) by mouth 2 (two) times daily with a meal.      Follow-up Information    Elinor DodgeFernando A Sanchez-Brugal, MD. Schedule an appointment as soon as possible for a visit in 1 week(s).   Specialty:  Internal Medicine Why:  Needs hypercoagulable work up in 3 weeks  Contact information: [address] [place] [id number] [phone]          Allergies  Allergen Reactions  . 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   Discharge Exam: Vitals:   11/11/15 2013 11/12/15 0451  BP: 131/89  127/81  Pulse: 72   Resp: 20 20  Temp: 97.4 F (36.3 C) 98.1 F (36.7 C)   Vitals:   11/11/15 0545 11/11/15 1345 11/11/15 2013 11/12/15 0451  BP:  103/66 131/89 127/81  Pulse:  86 72   Resp:  18 20 20   Temp:  97.5 F (36.4 C) 97.4 F (36.3 C) 98.1 F (36.7 C)  TempSrc:  Oral Oral Oral  SpO2: (!) 60% 90% 98% 93%  Weight:  Height:        General: Pt is alert, awake, not in acute distress Cardiovascular: RRR, S1/S2 +, no rubs, no gallops Respiratory: CTA bilaterally, no wheezing, no rhonchi Abdominal: Soft, NT, ND, bowel sounds + Extremities: no edema, no cyanosis   The results of significant diagnostics from this hospitalization (including imaging, microbiology, ancillary and laboratory) are listed below for reference.     Microbiology: Recent Results (from the past 240 hour(s))  MRSA PCR Screening     Status: None   Collection Time: 11/08/15  5:22 AM  Result Value Ref Range Status   MRSA by PCR NEGATIVE NEGATIVE Final    Comment:        The GeneXpert MRSA Assay (FDA approved for NASAL specimens only), is one component of a comprehensive MRSA colonization surveillance program. It is not intended to diagnose MRSA infection nor to guide or monitor treatment for MRSA infections.      Labs: BNP (last 3 results) No results for input(s): BNP in the last 8760 hours. Basic Metabolic Panel:  Recent Labs Lab 11/09/15 0221  NA 139  K 4.2  CL 105  CO2 27  GLUCOSE 116*  BUN 11  CREATININE 1.02  CALCIUM 8.3*   Liver Function Tests: No results for input(s): AST, ALT, ALKPHOS, BILITOT, PROT, ALBUMIN in the last 168 hours. No results for input(s): LIPASE, AMYLASE in the last 168 hours. No results for input(s): AMMONIA in the last 168 hours. CBC:  Recent Labs Lab 11/08/15 1807 11/09/15 0221 11/10/15 0633  WBC 5.7 6.1 5.1  HGB 11.7* 12.1* 11.0*  HCT 38.1* 40.4 36.0*  MCV 92.3 93.7 93.8  PLT 281 292 232   Cardiac Enzymes:  Recent Labs Lab  11/08/15 0754 11/08/15 1049 11/08/15 1807  TROPONINI <0.03 <0.03 <0.03   BNP: Invalid input(s): POCBNP CBG: No results for input(s): GLUCAP in the last 168 hours. D-Dimer No results for input(s): DDIMER in the last 72 hours. Hgb A1c No results for input(s): HGBA1C in the last 72 hours. Lipid Profile No results for input(s): CHOL, HDL, LDLCALC, TRIG, CHOLHDL, LDLDIRECT in the last 72 hours. Thyroid function studies No results for input(s): TSH, T4TOTAL, T3FREE, THYROIDAB in the last 72 hours.  Invalid input(s): FREET3 Anemia work up No results for input(s): VITAMINB12, FOLATE, FERRITIN, TIBC, IRON, RETICCTPCT in the last 72 hours. Urinalysis No results found for: COLORURINE, APPEARANCEUR, LABSPEC, PHURINE, GLUCOSEU, HGBUR, BILIRUBINUR, KETONESUR, PROTEINUR, UROBILINOGEN, NITRITE, LEUKOCYTESUR Sepsis Labs Invalid input(s): PROCALCITONIN,  WBC,  LACTICIDVEN Microbiology Recent Results (from the past 240 hour(s))  MRSA PCR Screening     Status: None   Collection Time: 11/08/15  5:22 AM  Result Value Ref Range Status   MRSA by PCR NEGATIVE NEGATIVE Final    Comment:        The GeneXpert MRSA Assay (FDA approved for NASAL specimens only), is one component of a comprehensive MRSA colonization surveillance program. It is not intended to diagnose MRSA infection nor to guide or monitor treatment for MRSA infections.     SIGNED:  Latrelle Dodrill, MD  [facility] 11/12/2015, 8:23 AM Pager   If 7PM-7AM, please contact night-coverage [email or web address] Password [email or web address]

## 2015-12-26 DIAGNOSIS — F1120 Opioid dependence, uncomplicated: Secondary | ICD-10-CM | POA: Diagnosis not present

## 2015-12-26 DIAGNOSIS — I82509 Chronic embolism and thrombosis of unspecified deep veins of unspecified lower extremity: Secondary | ICD-10-CM | POA: Diagnosis not present

## 2015-12-26 DIAGNOSIS — I2699 Other pulmonary embolism without acute cor pulmonale: Secondary | ICD-10-CM | POA: Diagnosis not present

## 2015-12-26 DIAGNOSIS — I2609 Other pulmonary embolism with acute cor pulmonale: Secondary | ICD-10-CM | POA: Diagnosis not present

## 2016-01-09 DIAGNOSIS — Z79899 Other long term (current) drug therapy: Secondary | ICD-10-CM | POA: Diagnosis not present

## 2016-01-09 DIAGNOSIS — I82509 Chronic embolism and thrombosis of unspecified deep veins of unspecified lower extremity: Secondary | ICD-10-CM | POA: Diagnosis not present

## 2016-01-09 DIAGNOSIS — I2609 Other pulmonary embolism with acute cor pulmonale: Secondary | ICD-10-CM | POA: Diagnosis not present

## 2016-01-09 DIAGNOSIS — F1120 Opioid dependence, uncomplicated: Secondary | ICD-10-CM | POA: Diagnosis not present

## 2016-01-16 DIAGNOSIS — G8929 Other chronic pain: Secondary | ICD-10-CM | POA: Diagnosis not present

## 2016-01-16 DIAGNOSIS — Z79899 Other long term (current) drug therapy: Secondary | ICD-10-CM | POA: Diagnosis not present

## 2016-02-15 DIAGNOSIS — M791 Myalgia: Secondary | ICD-10-CM | POA: Diagnosis not present

## 2016-02-15 DIAGNOSIS — Z79891 Long term (current) use of opiate analgesic: Secondary | ICD-10-CM | POA: Diagnosis not present

## 2016-02-15 DIAGNOSIS — M545 Low back pain: Secondary | ICD-10-CM | POA: Diagnosis not present

## 2016-02-15 DIAGNOSIS — M25551 Pain in right hip: Secondary | ICD-10-CM | POA: Diagnosis not present

## 2016-02-19 DIAGNOSIS — M25551 Pain in right hip: Secondary | ICD-10-CM | POA: Diagnosis not present

## 2016-02-19 DIAGNOSIS — R413 Other amnesia: Secondary | ICD-10-CM | POA: Diagnosis not present

## 2016-02-19 DIAGNOSIS — M545 Low back pain: Secondary | ICD-10-CM | POA: Diagnosis not present

## 2016-02-19 DIAGNOSIS — I10 Essential (primary) hypertension: Secondary | ICD-10-CM | POA: Diagnosis not present

## 2016-02-29 DIAGNOSIS — I10 Essential (primary) hypertension: Secondary | ICD-10-CM | POA: Diagnosis not present

## 2016-02-29 DIAGNOSIS — R413 Other amnesia: Secondary | ICD-10-CM | POA: Diagnosis not present

## 2016-02-29 DIAGNOSIS — Z13220 Encounter for screening for lipoid disorders: Secondary | ICD-10-CM | POA: Diagnosis not present

## 2016-02-29 DIAGNOSIS — Z86711 Personal history of pulmonary embolism: Secondary | ICD-10-CM | POA: Diagnosis not present

## 2016-03-07 DIAGNOSIS — J309 Allergic rhinitis, unspecified: Secondary | ICD-10-CM | POA: Diagnosis not present

## 2016-03-07 DIAGNOSIS — I10 Essential (primary) hypertension: Secondary | ICD-10-CM | POA: Diagnosis not present

## 2016-03-12 DIAGNOSIS — D72829 Elevated white blood cell count, unspecified: Secondary | ICD-10-CM | POA: Diagnosis not present

## 2016-03-12 DIAGNOSIS — M47816 Spondylosis without myelopathy or radiculopathy, lumbar region: Secondary | ICD-10-CM | POA: Diagnosis not present

## 2016-03-12 DIAGNOSIS — Z96649 Presence of unspecified artificial hip joint: Secondary | ICD-10-CM | POA: Diagnosis not present

## 2016-03-12 DIAGNOSIS — G894 Chronic pain syndrome: Secondary | ICD-10-CM | POA: Diagnosis not present

## 2016-03-12 DIAGNOSIS — F419 Anxiety disorder, unspecified: Secondary | ICD-10-CM | POA: Diagnosis not present

## 2016-03-12 DIAGNOSIS — M1611 Unilateral primary osteoarthritis, right hip: Secondary | ICD-10-CM | POA: Diagnosis not present

## 2016-03-18 DIAGNOSIS — M25551 Pain in right hip: Secondary | ICD-10-CM | POA: Diagnosis not present

## 2016-03-18 DIAGNOSIS — Z6822 Body mass index (BMI) 22.0-22.9, adult: Secondary | ICD-10-CM | POA: Diagnosis not present

## 2016-03-18 DIAGNOSIS — F1190 Opioid use, unspecified, uncomplicated: Secondary | ICD-10-CM | POA: Diagnosis not present

## 2016-03-18 DIAGNOSIS — Z79899 Other long term (current) drug therapy: Secondary | ICD-10-CM | POA: Diagnosis not present

## 2016-03-18 DIAGNOSIS — G8929 Other chronic pain: Secondary | ICD-10-CM | POA: Diagnosis not present

## 2016-12-30 DIAGNOSIS — I10 Essential (primary) hypertension: Secondary | ICD-10-CM | POA: Diagnosis not present

## 2016-12-30 DIAGNOSIS — I2692 Saddle embolus of pulmonary artery without acute cor pulmonale: Secondary | ICD-10-CM | POA: Diagnosis not present

## 2016-12-30 DIAGNOSIS — F4312 Post-traumatic stress disorder, chronic: Secondary | ICD-10-CM | POA: Diagnosis not present

## 2016-12-30 DIAGNOSIS — M87051 Idiopathic aseptic necrosis of right femur: Secondary | ICD-10-CM | POA: Diagnosis not present

## 2016-12-30 DIAGNOSIS — F331 Major depressive disorder, recurrent, moderate: Secondary | ICD-10-CM | POA: Diagnosis not present

## 2017-01-19 DIAGNOSIS — E782 Mixed hyperlipidemia: Secondary | ICD-10-CM | POA: Diagnosis not present

## 2017-01-19 DIAGNOSIS — I2692 Saddle embolus of pulmonary artery without acute cor pulmonale: Secondary | ICD-10-CM | POA: Diagnosis not present

## 2017-01-19 DIAGNOSIS — I10 Essential (primary) hypertension: Secondary | ICD-10-CM | POA: Diagnosis not present

## 2017-01-19 DIAGNOSIS — F314 Bipolar disorder, current episode depressed, severe, without psychotic features: Secondary | ICD-10-CM | POA: Diagnosis not present

## 2017-01-19 DIAGNOSIS — R55 Syncope and collapse: Secondary | ICD-10-CM | POA: Diagnosis not present

## 2017-03-19 DIAGNOSIS — K219 Gastro-esophageal reflux disease without esophagitis: Secondary | ICD-10-CM | POA: Diagnosis not present

## 2017-03-19 DIAGNOSIS — F419 Anxiety disorder, unspecified: Secondary | ICD-10-CM | POA: Diagnosis not present

## 2017-03-19 DIAGNOSIS — Z86711 Personal history of pulmonary embolism: Secondary | ICD-10-CM | POA: Diagnosis not present

## 2017-03-19 DIAGNOSIS — J45909 Unspecified asthma, uncomplicated: Secondary | ICD-10-CM | POA: Diagnosis not present

## 2017-03-19 DIAGNOSIS — M87851 Other osteonecrosis, right femur: Secondary | ICD-10-CM | POA: Diagnosis not present

## 2017-03-19 DIAGNOSIS — G894 Chronic pain syndrome: Secondary | ICD-10-CM | POA: Diagnosis not present

## 2017-03-19 DIAGNOSIS — I10 Essential (primary) hypertension: Secondary | ICD-10-CM | POA: Diagnosis not present

## 2017-03-31 DIAGNOSIS — J189 Pneumonia, unspecified organism: Secondary | ICD-10-CM

## 2017-03-31 DIAGNOSIS — R0902 Hypoxemia: Secondary | ICD-10-CM

## 2017-03-31 DIAGNOSIS — R4182 Altered mental status, unspecified: Secondary | ICD-10-CM | POA: Diagnosis not present

## 2017-03-31 DIAGNOSIS — I2699 Other pulmonary embolism without acute cor pulmonale: Secondary | ICD-10-CM | POA: Diagnosis not present

## 2017-03-31 DIAGNOSIS — I10 Essential (primary) hypertension: Secondary | ICD-10-CM | POA: Diagnosis not present

## 2017-04-01 DIAGNOSIS — I2699 Other pulmonary embolism without acute cor pulmonale: Secondary | ICD-10-CM | POA: Diagnosis not present

## 2017-04-01 DIAGNOSIS — J189 Pneumonia, unspecified organism: Secondary | ICD-10-CM | POA: Diagnosis not present

## 2017-04-01 DIAGNOSIS — I10 Essential (primary) hypertension: Secondary | ICD-10-CM | POA: Diagnosis not present

## 2017-04-01 DIAGNOSIS — R4182 Altered mental status, unspecified: Secondary | ICD-10-CM | POA: Diagnosis not present

## 2017-04-02 DIAGNOSIS — J189 Pneumonia, unspecified organism: Secondary | ICD-10-CM | POA: Diagnosis not present

## 2017-04-02 DIAGNOSIS — I10 Essential (primary) hypertension: Secondary | ICD-10-CM | POA: Diagnosis not present

## 2017-04-02 DIAGNOSIS — R4182 Altered mental status, unspecified: Secondary | ICD-10-CM | POA: Diagnosis not present

## 2017-04-02 DIAGNOSIS — I2699 Other pulmonary embolism without acute cor pulmonale: Secondary | ICD-10-CM | POA: Diagnosis not present

## 2017-04-03 DIAGNOSIS — R4182 Altered mental status, unspecified: Secondary | ICD-10-CM | POA: Diagnosis not present

## 2017-04-03 DIAGNOSIS — I2699 Other pulmonary embolism without acute cor pulmonale: Secondary | ICD-10-CM | POA: Diagnosis not present

## 2017-04-03 DIAGNOSIS — J189 Pneumonia, unspecified organism: Secondary | ICD-10-CM | POA: Diagnosis not present

## 2017-04-03 DIAGNOSIS — I10 Essential (primary) hypertension: Secondary | ICD-10-CM | POA: Diagnosis not present

## 2017-04-04 DIAGNOSIS — I10 Essential (primary) hypertension: Secondary | ICD-10-CM | POA: Diagnosis not present

## 2017-04-04 DIAGNOSIS — J189 Pneumonia, unspecified organism: Secondary | ICD-10-CM | POA: Diagnosis not present

## 2017-04-04 DIAGNOSIS — I2699 Other pulmonary embolism without acute cor pulmonale: Secondary | ICD-10-CM | POA: Diagnosis not present

## 2017-04-04 DIAGNOSIS — R4182 Altered mental status, unspecified: Secondary | ICD-10-CM | POA: Diagnosis not present

## 2017-04-05 DIAGNOSIS — I10 Essential (primary) hypertension: Secondary | ICD-10-CM | POA: Diagnosis not present

## 2017-04-05 DIAGNOSIS — J189 Pneumonia, unspecified organism: Secondary | ICD-10-CM | POA: Diagnosis not present

## 2017-04-05 DIAGNOSIS — R4182 Altered mental status, unspecified: Secondary | ICD-10-CM | POA: Diagnosis not present

## 2017-04-05 DIAGNOSIS — I2699 Other pulmonary embolism without acute cor pulmonale: Secondary | ICD-10-CM | POA: Diagnosis not present

## 2017-04-06 DIAGNOSIS — I2699 Other pulmonary embolism without acute cor pulmonale: Secondary | ICD-10-CM | POA: Diagnosis not present

## 2017-04-06 DIAGNOSIS — J189 Pneumonia, unspecified organism: Secondary | ICD-10-CM | POA: Diagnosis not present

## 2017-04-06 DIAGNOSIS — R4182 Altered mental status, unspecified: Secondary | ICD-10-CM | POA: Diagnosis not present

## 2017-04-06 DIAGNOSIS — I10 Essential (primary) hypertension: Secondary | ICD-10-CM | POA: Diagnosis not present

## 2017-04-07 DIAGNOSIS — J189 Pneumonia, unspecified organism: Secondary | ICD-10-CM | POA: Diagnosis not present

## 2017-04-07 DIAGNOSIS — R0602 Shortness of breath: Secondary | ICD-10-CM

## 2017-04-07 DIAGNOSIS — R4182 Altered mental status, unspecified: Secondary | ICD-10-CM | POA: Diagnosis not present

## 2017-04-07 DIAGNOSIS — I10 Essential (primary) hypertension: Secondary | ICD-10-CM | POA: Diagnosis not present

## 2017-04-07 DIAGNOSIS — I2699 Other pulmonary embolism without acute cor pulmonale: Secondary | ICD-10-CM | POA: Diagnosis not present

## 2017-04-08 DIAGNOSIS — I10 Essential (primary) hypertension: Secondary | ICD-10-CM | POA: Diagnosis not present

## 2017-04-08 DIAGNOSIS — I2699 Other pulmonary embolism without acute cor pulmonale: Secondary | ICD-10-CM | POA: Diagnosis not present

## 2017-04-08 DIAGNOSIS — R0902 Hypoxemia: Secondary | ICD-10-CM | POA: Diagnosis not present

## 2017-04-08 DIAGNOSIS — R4182 Altered mental status, unspecified: Secondary | ICD-10-CM | POA: Diagnosis not present

## 2017-04-08 DIAGNOSIS — J189 Pneumonia, unspecified organism: Secondary | ICD-10-CM | POA: Diagnosis not present

## 2017-04-09 DIAGNOSIS — R4182 Altered mental status, unspecified: Secondary | ICD-10-CM | POA: Diagnosis not present

## 2017-04-09 DIAGNOSIS — J189 Pneumonia, unspecified organism: Secondary | ICD-10-CM | POA: Diagnosis not present

## 2017-04-09 DIAGNOSIS — I2699 Other pulmonary embolism without acute cor pulmonale: Secondary | ICD-10-CM | POA: Diagnosis not present

## 2017-04-09 DIAGNOSIS — I10 Essential (primary) hypertension: Secondary | ICD-10-CM | POA: Diagnosis not present

## 2017-05-07 DIAGNOSIS — I10 Essential (primary) hypertension: Secondary | ICD-10-CM | POA: Diagnosis not present

## 2017-05-07 DIAGNOSIS — I959 Hypotension, unspecified: Secondary | ICD-10-CM

## 2017-05-07 DIAGNOSIS — N179 Acute kidney failure, unspecified: Secondary | ICD-10-CM | POA: Diagnosis not present

## 2017-05-07 DIAGNOSIS — E860 Dehydration: Secondary | ICD-10-CM | POA: Diagnosis not present

## 2017-05-08 DIAGNOSIS — E860 Dehydration: Secondary | ICD-10-CM | POA: Diagnosis not present

## 2017-05-08 DIAGNOSIS — I10 Essential (primary) hypertension: Secondary | ICD-10-CM | POA: Diagnosis not present

## 2017-05-08 DIAGNOSIS — N179 Acute kidney failure, unspecified: Secondary | ICD-10-CM | POA: Diagnosis not present

## 2017-05-08 DIAGNOSIS — I959 Hypotension, unspecified: Secondary | ICD-10-CM | POA: Diagnosis not present

## 2017-05-09 DIAGNOSIS — I959 Hypotension, unspecified: Secondary | ICD-10-CM | POA: Diagnosis not present

## 2017-05-09 DIAGNOSIS — E860 Dehydration: Secondary | ICD-10-CM | POA: Diagnosis not present

## 2017-05-09 DIAGNOSIS — I10 Essential (primary) hypertension: Secondary | ICD-10-CM | POA: Diagnosis not present

## 2017-05-09 DIAGNOSIS — N179 Acute kidney failure, unspecified: Secondary | ICD-10-CM | POA: Diagnosis not present

## 2018-05-19 DEATH — deceased
